# Patient Record
Sex: Female | Born: 1985 | Race: White | Hispanic: No | Marital: Married | Smoking: Never smoker
Health system: Southern US, Community
[De-identification: ages and names within clinical notes are randomized; demographics above are authoritative.]

## PROBLEM LIST (undated history)

## (undated) DIAGNOSIS — G43909 Migraine, unspecified, not intractable, without status migrainosus: Secondary | ICD-10-CM

## (undated) HISTORY — PX: TUBAL LIGATION: SHX77

---

## 2005-09-11 ENCOUNTER — Other Ambulatory Visit: Admission: RE | Admit: 2005-09-11 | Discharge: 2005-09-11 | Payer: Self-pay | Admitting: Obstetrics and Gynecology

## 2006-01-27 ENCOUNTER — Ambulatory Visit (HOSPITAL_COMMUNITY): Admission: RE | Admit: 2006-01-27 | Discharge: 2006-01-27 | Payer: Self-pay | Admitting: Obstetrics and Gynecology

## 2006-02-25 ENCOUNTER — Inpatient Hospital Stay (HOSPITAL_COMMUNITY): Admission: AD | Admit: 2006-02-25 | Discharge: 2006-02-26 | Payer: Self-pay | Admitting: Gynecology

## 2006-03-01 ENCOUNTER — Inpatient Hospital Stay (HOSPITAL_COMMUNITY): Admission: AD | Admit: 2006-03-01 | Discharge: 2006-03-01 | Payer: Self-pay | Admitting: Obstetrics & Gynecology

## 2006-03-24 ENCOUNTER — Inpatient Hospital Stay (HOSPITAL_COMMUNITY): Admission: AD | Admit: 2006-03-24 | Discharge: 2006-03-24 | Payer: Self-pay | Admitting: Obstetrics and Gynecology

## 2006-04-04 ENCOUNTER — Inpatient Hospital Stay (HOSPITAL_COMMUNITY): Admission: AD | Admit: 2006-04-04 | Discharge: 2006-04-04 | Payer: Self-pay | Admitting: Obstetrics and Gynecology

## 2006-04-14 ENCOUNTER — Inpatient Hospital Stay (HOSPITAL_COMMUNITY): Admission: AD | Admit: 2006-04-14 | Discharge: 2006-04-15 | Payer: Self-pay | Admitting: Obstetrics and Gynecology

## 2006-04-17 ENCOUNTER — Inpatient Hospital Stay (HOSPITAL_COMMUNITY): Admission: AD | Admit: 2006-04-17 | Discharge: 2006-04-20 | Payer: Self-pay | Admitting: Obstetrics & Gynecology

## 2007-01-13 ENCOUNTER — Ambulatory Visit (HOSPITAL_COMMUNITY): Admission: RE | Admit: 2007-01-13 | Discharge: 2007-01-13 | Payer: Self-pay | Admitting: Obstetrics and Gynecology

## 2007-01-19 ENCOUNTER — Ambulatory Visit (HOSPITAL_COMMUNITY): Admission: RE | Admit: 2007-01-19 | Discharge: 2007-01-19 | Payer: Self-pay | Admitting: Obstetrics and Gynecology

## 2007-04-05 ENCOUNTER — Inpatient Hospital Stay (HOSPITAL_COMMUNITY): Admission: AD | Admit: 2007-04-05 | Discharge: 2007-04-05 | Payer: Self-pay | Admitting: Obstetrics & Gynecology

## 2007-04-07 ENCOUNTER — Inpatient Hospital Stay (HOSPITAL_COMMUNITY): Admission: AD | Admit: 2007-04-07 | Discharge: 2007-04-08 | Payer: Self-pay | Admitting: Obstetrics and Gynecology

## 2008-05-24 IMAGING — US US OB LIMITED
1 series · 14 of 25 positions shown · non-contrast
Comparison: none

OBSTETRICAL ULTRASOUND:

 This ultrasound exam was performed in the [HOSPITAL] Ultrasound Department.  The OB US report was generated in the AS system, and faxed to the ordering physician.  This report is also available in [REDACTED] PACS.

[Series 1: us ob limited · 0.32mm/px · 14 of 25 slices shown]
[im 1/25]
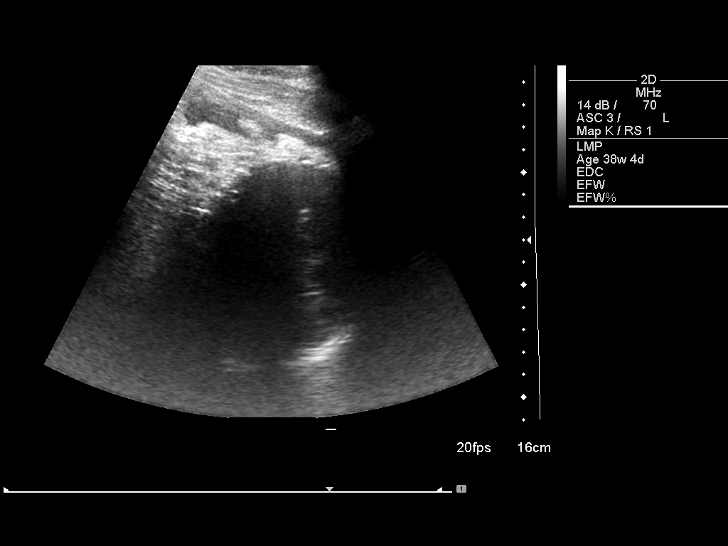
[im 3/25]
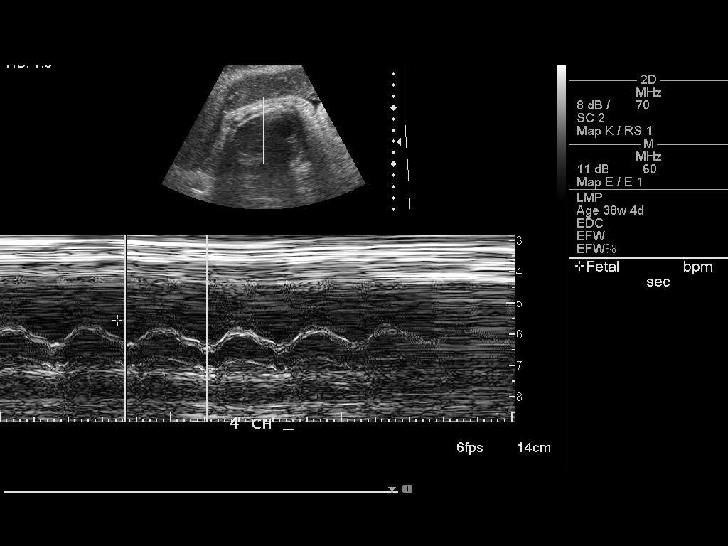
[im 5/25]
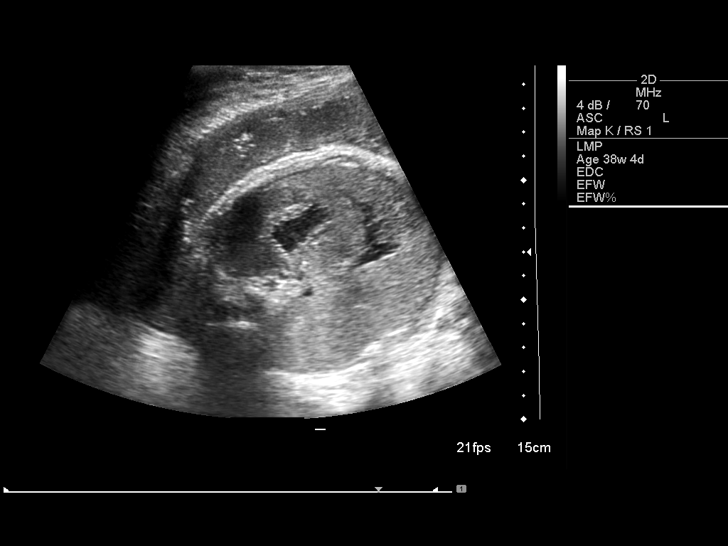
[im 7/25]
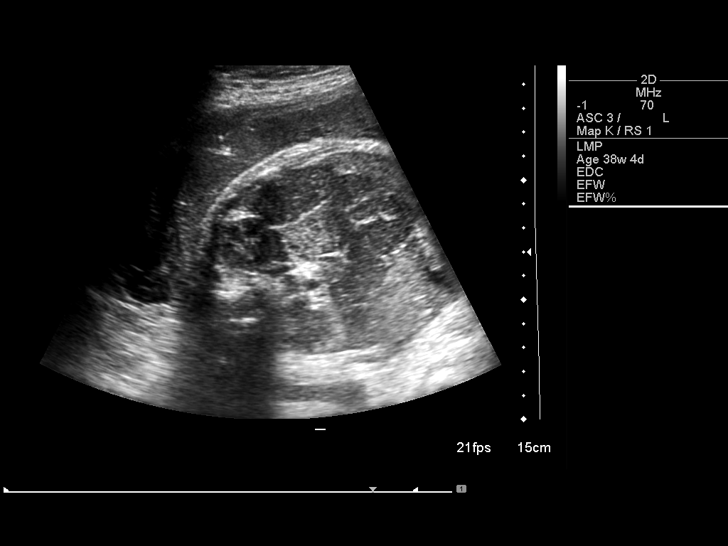
[im 9/25]
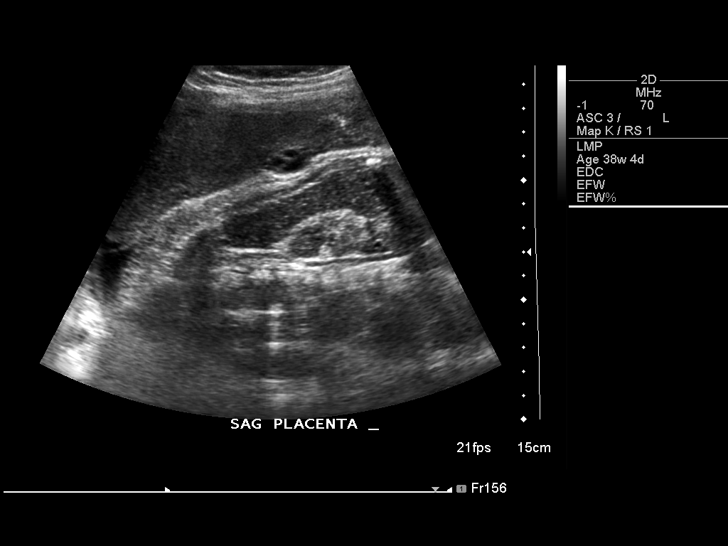
[im 10/25]
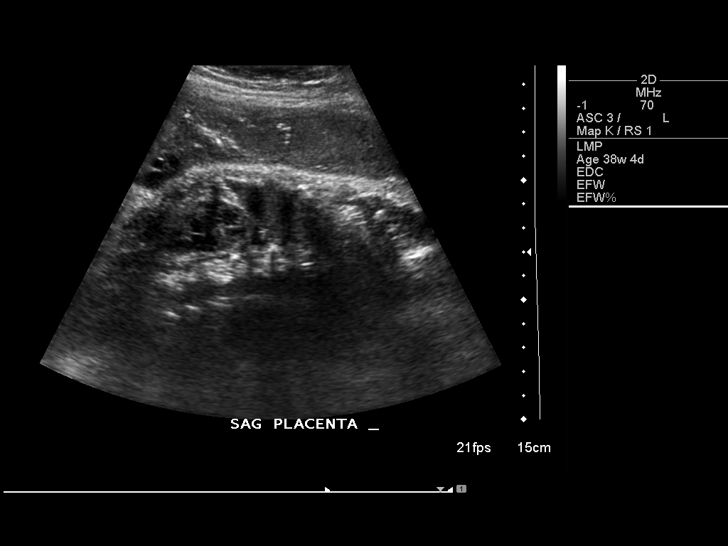
[im 12/25]
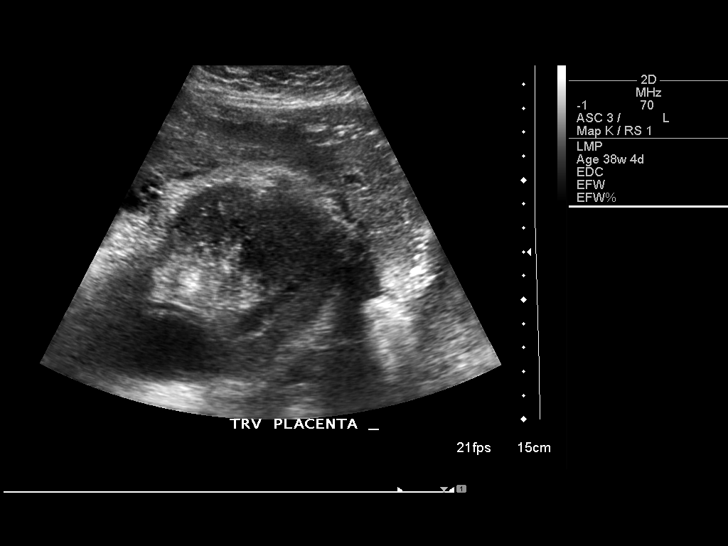
[im 14/25]
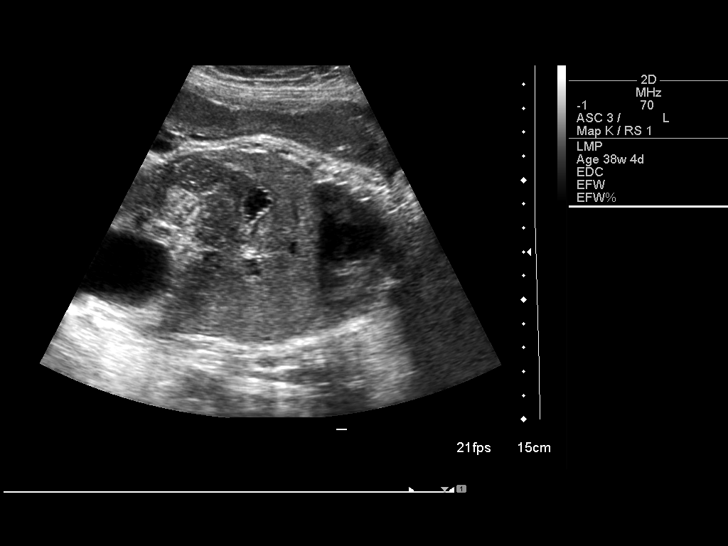
[im 16/25]
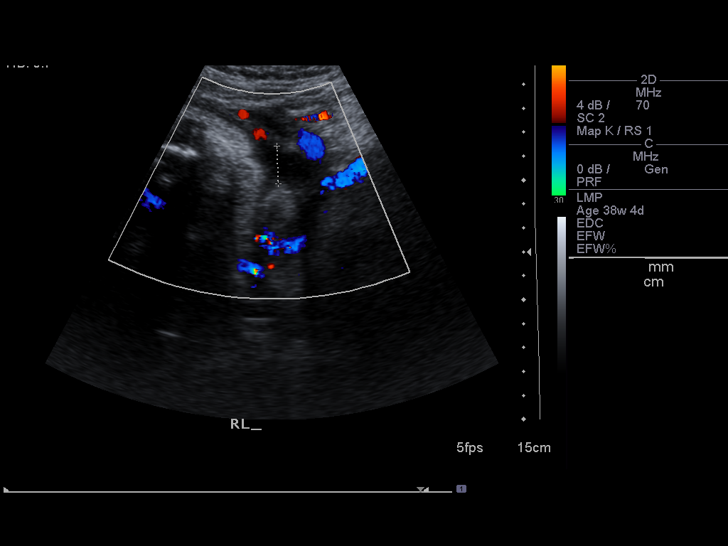
[im 17/25]
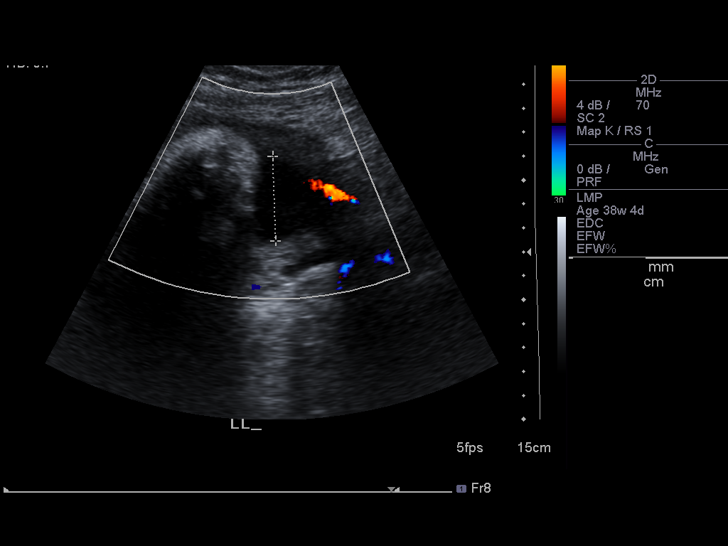
[im 19/25]
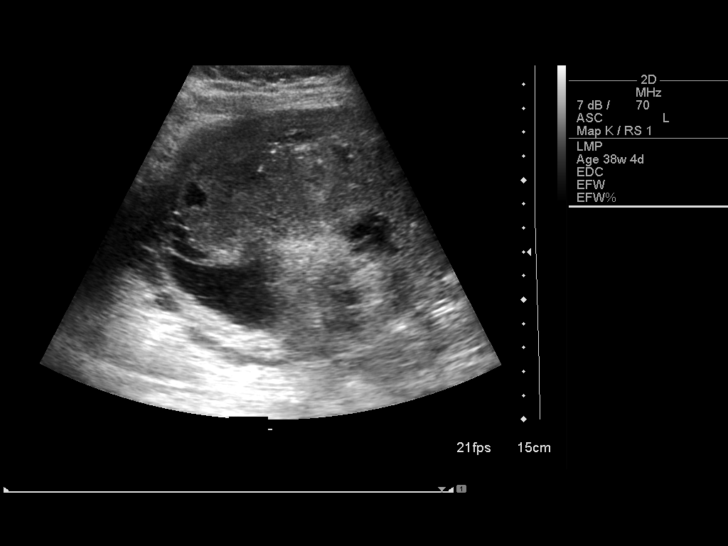
[im 21/25]
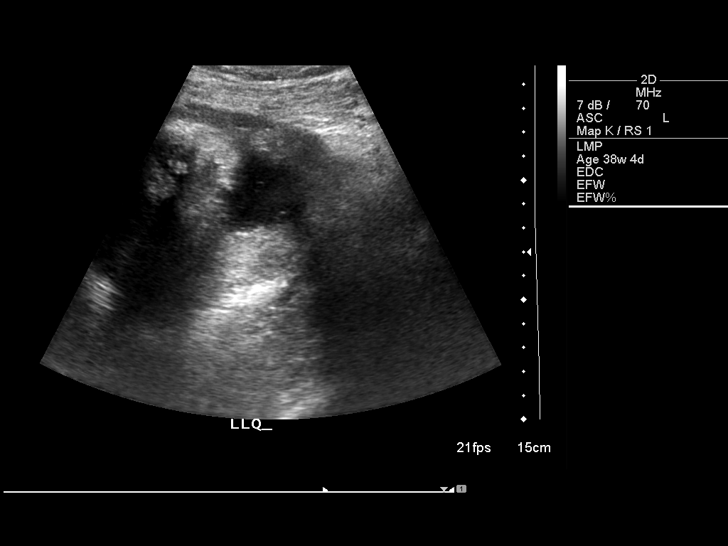
[im 23/25]
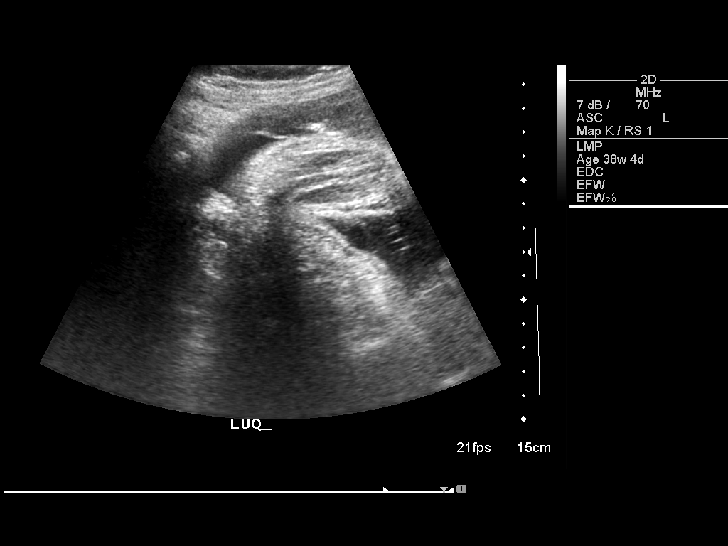
[im 25/25]
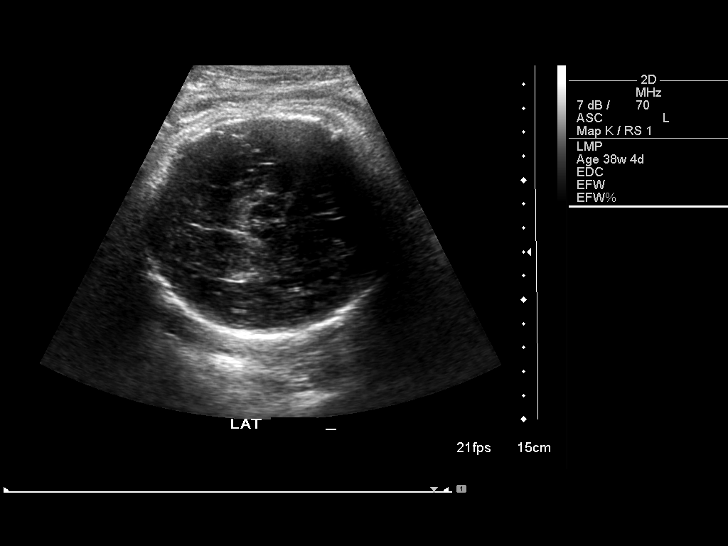

[14 of 25 positions shown; findings below may reference images not displayed]

IMPRESSION: See AS Obstetric US report.

## 2010-02-07 ENCOUNTER — Emergency Department (HOSPITAL_COMMUNITY): Admission: EM | Admit: 2010-02-07 | Discharge: 2010-02-07 | Payer: Self-pay | Admitting: Emergency Medicine

## 2011-04-10 LAB — CBC
HCT: 36.2
Hemoglobin: 12.1
Hemoglobin: 12.5
MCHC: 34.9
MCV: 90.4
MCV: 90.6
Platelets: 153
RBC: 4
WBC: 12 — ABNORMAL HIGH
WBC: 9.1

## 2011-04-10 LAB — RPR: RPR Ser Ql: NONREACTIVE

## 2011-04-14 LAB — RH IMMUNE GLOBULIN WORKUP (NOT WOMEN'S HOSP): Antibody Screen: NEGATIVE

## 2011-04-14 LAB — GLUCOSE TOLERANCE, 1 HOUR: Glucose, 1 Hour GTT: 102

## 2018-06-03 ENCOUNTER — Other Ambulatory Visit: Payer: Self-pay

## 2018-06-03 ENCOUNTER — Emergency Department (INDEPENDENT_AMBULATORY_CARE_PROVIDER_SITE_OTHER)
Admission: EM | Admit: 2018-06-03 | Discharge: 2018-06-03 | Disposition: A | Discharge status: Home / Self Care | Payer: BLUE CROSS/BLUE SHIELD | Source: Home / Self Care

## 2018-06-03 ENCOUNTER — Encounter: Payer: Self-pay | Admitting: Family Medicine

## 2018-06-03 DIAGNOSIS — G43709 Chronic migraine without aura, not intractable, without status migrainosus: Secondary | ICD-10-CM | POA: Diagnosis not present

## 2018-06-03 MED ORDER — SUMATRIPTAN SUCCINATE 100 MG PO TABS: 100.0000 mg | ORAL_TABLET | ORAL | 3 refills | Status: DC | PRN

## 2018-06-03 MED ORDER — DEXAMETHASONE SODIUM PHOSPHATE 10 MG/ML IJ SOLN
10.0000 mg | Freq: Once | INTRAMUSCULAR | Status: AC
  Administered 2018-06-03: 10 mg via INTRAMUSCULAR

## 2018-06-03 NOTE — ED Provider Notes (Signed)
[facility]    CSN: [id number] Arrival date & time: 06/03/18  1617     History   Chief Complaint Chief Complaint  Patient presents with  . Headache    HPI Nancy Gregory is a 32 y.o. female.   This is an initial [place] urgent care visit for this 32 year old woman who reports she has had migraines since having her fourth child, but never been diagnosed by a Provider. It is more on the right side of her head. Does have light sensitivity and N/V. Took some tylenol at 0900 and ibuprofen at 1400 - vomited both back up. She does report the last migraine she had was about 4 months ago, she had eaten garlic at the time. She also had garlic last night.   She is currently on the keto diet and gave up caffeine 2 weeks ago.   Patient describes typical migraine: Right-sided headache often radiating to behind the eye and in the ear and down the right side of the neck.  Its throbbing.  It is associated with nausea and vomiting.  Typically last 12 to 24 hours.  She gets them every 1 to 2 months.  This particular headache was incapacitating and that she had vomiting and such severe headache that she was unable to take care of her kids: 15, 11, 7, and 3.     History reviewed. No pertinent past medical history.  There are no active problems to display for this patient.   History reviewed. No pertinent surgical history.  OB History   None      Home Medications    Prior to Admission medications   Medication Sig Start Date End Date Taking? Authorizing Provider  SUMAtriptan (IMITREX) 100 MG tablet Take 1 tablet (100 mg total) by mouth every 2 (two) hours as needed for migraine. May repeat in 2 hours if headache persists or recurs. 06/03/18   Elvina Sidle, MD    Family History No family history on file.  Social History Social History   Tobacco Use  . Smoking status: Not on file  Substance Use Topics  . Alcohol use: Not on file  . Drug use: Not on file      Allergies   Patient has no known allergies.   Review of Systems Review of Systems  Constitutional: Negative.   HENT: Negative.   Eyes: Positive for pain.  Respiratory: Negative.   Gastrointestinal: Positive for nausea and vomiting.  Neurological: Positive for headaches.  All other systems reviewed and are negative.    Physical Exam Triage Vital Signs ED Triage Vitals  Enc Vitals Group     BP 06/03/18 1634 119/83     Pulse Rate 06/03/18 1634 (!) 131     Resp --      Temp 06/03/18 1634 98.3 F (36.8 C)     Temp Source 06/03/18 1634 Oral     SpO2 06/03/18 1634 99 %     Weight 06/03/18 1635 187 lb (84.8 kg)     Height 06/03/18 1635 5\' 6"  (1.676 m)     Head Circumference --      Peak Flow --      Pain Score 06/03/18 1635 8     Pain Loc --      Pain Edu? --      Excl. in GC? --    No data found.  Updated Vital Signs BP 119/83 (BP Location: Left Arm)   Pulse (!) 131   Temp 98.3 F (36.8 C) (  Oral)   Ht 5\' 6"  (1.676 m)   Wt 84.8 kg   SpO2 99%   BMI 30.18 kg/m    Physical Exam  Constitutional: She is oriented to person, place, and time. She appears well-developed and well-nourished.  HENT:  Head: Normocephalic.  Mouth/Throat: Oropharynx is clear and moist.  Eyes: Pupils are equal, round, and reactive to light. EOM are normal.  Neck: Normal range of motion. Neck supple.  Pulmonary/Chest: Effort normal.  Abdominal: Soft.  Musculoskeletal: Normal range of motion.  Neurological: She is alert and oriented to person, place, and time. She has normal strength. She is not disoriented. No cranial nerve deficit or sensory deficit. She displays a negative Romberg sign. Coordination and gait normal.  Skin: Skin is warm.  Psychiatric: She has a normal mood and affect. Her behavior is normal.  Nursing note and vitals reviewed.    UC Treatments / Results  Labs (all labs ordered are listed, but only abnormal results are displayed) Labs Reviewed - No data to  display  EKG None  Radiology No results found.  Procedures Procedures (including critical care time)  Medications Ordered in UC Medications  dexamethasone (DECADRON) injection 10 mg (has no administration in time range)    Initial Impression / Assessment and Plan / UC Course  I have reviewed the triage vital signs and the nursing notes.  Pertinent labs & imaging results that were available during my care of the patient were reviewed by me and considered in my medical decision making (see chart for details).    Final Clinical Impressions(s) / UC Diagnoses   Final diagnoses:  Chronic migraine without aura without status migrainosus, not intractable     Discharge Instructions     Limit Imitrex to 2 tablets in any 24-hour.    ED Prescriptions    Medication Sig Dispense Auth. Provider   SUMAtriptan (IMITREX) 100 MG tablet Take 1 tablet (100 mg total) by mouth every 2 (two) hours as needed for migraine. May repeat in 2 hours if headache persists or recurs. 10 tablet Elvina SidleLauenstein, Yarenis Cerino, MD     Controlled Substance Prescriptions [place] Controlled Substance Registry consulted? Not Applicable   Elvina SidleLauenstein, Jayani Rozman, MD 06/03/18 1700

## 2018-06-03 NOTE — ED Triage Notes (Signed)
Pt reports she has had migraines since having her fourth child, but never been diagnosed by a Provider. It is more on the right side of her head. Does have light sensitivity and N/V. Took some tylenol at 0900 and ibuprofen at 1400 - vomited both back up. She does report the last migraine she had was about 4 months ago, she had eaten garlic at the time. She also had garlic last night.   She is currently on the keto diet and gave up caffeine 2 weeks ago.

## 2018-06-03 NOTE — Discharge Instructions (Addendum)
Limit Imitrex to 2 tablets in any 24-hour.

## 2018-07-07 ENCOUNTER — Encounter: Payer: Self-pay | Admitting: *Deleted

## 2018-07-07 ENCOUNTER — Emergency Department (INDEPENDENT_AMBULATORY_CARE_PROVIDER_SITE_OTHER)
Admission: EM | Admit: 2018-07-07 | Discharge: 2018-07-07 | Disposition: A | Discharge status: Home / Self Care | Payer: BLUE CROSS/BLUE SHIELD | Source: Home / Self Care | Attending: Family Medicine | Admitting: Family Medicine

## 2018-07-07 ENCOUNTER — Other Ambulatory Visit: Payer: Self-pay

## 2018-07-07 DIAGNOSIS — R109 Unspecified abdominal pain: Secondary | ICD-10-CM

## 2018-07-07 DIAGNOSIS — N3001 Acute cystitis with hematuria: Secondary | ICD-10-CM

## 2018-07-07 HISTORY — DX: Migraine, unspecified, not intractable, without status migrainosus: G43.909

## 2018-07-07 LAB — POCT URINALYSIS DIP (MANUAL ENTRY)
Glucose, UA: NEGATIVE mg/dL
Nitrite, UA: NEGATIVE
Protein Ur, POC: 30 mg/dL — AB
Spec Grav, UA: 1.015 (ref 1.010–1.025)
Urobilinogen, UA: 1 E.U./dL
pH, UA: 7 (ref 5.0–8.0)

## 2018-07-07 MED ORDER — CEPHALEXIN 500 MG PO CAPS: 500.0000 mg | ORAL_CAPSULE | Freq: Two times a day (BID) | ORAL | 0 refills | Status: DC

## 2018-07-07 NOTE — Discharge Instructions (Signed)
  Please take your antibiotic as prescribed. A urine culture has been sent to check the severity of your urinary infection and to determine if you are on the most appropriate antibiotic. The results should come back within 2-3 days and you will be notified even if no medication change is needed.  Please stay well hydrated and follow up with your family doctor in 1 week if not improving, sooner if worsening.  

## 2018-07-07 NOTE — ED Provider Notes (Signed)
[facility]    CSN: [id number] Arrival date & time: 07/07/18  1119     History   Chief Complaint Chief Complaint  Patient presents with  . Urinary Frequency  . Flank Pain    HPI Nancy Gregory is a 33 y.o. female.   HPI Nancy Gregory is a 32 y.o. female presenting to UC with c/o 2-3 days of urinary frequency and lower abdominal cramping radiating to her back on occasion. She was on vacation recently and was "holding" her urine for long periods of times. She also did not shower the last 2 days and wonders if that has contributed to her symptoms. Last UTI was a few years ago.  Denies fever or chills. She has had nausea with loss of appetite. No vomiting or diarrhea. She has not tried any OTC medications.    Past Medical History:  Diagnosis Date  . Migraine     There are no active problems to display for this patient.   Past Surgical History:  Procedure Laterality Date  . TUBAL LIGATION      OB History   No obstetric history on file.      Home Medications    Prior to Admission medications   Medication Sig Start Date End Date Taking? Authorizing Provider  cephALEXin (KEFLEX) 500 MG capsule Take 1 capsule (500 mg total) by mouth 2 (two) times daily. 07/07/18   Lurene Shadow, PA-C  SUMAtriptan (IMITREX) 100 MG tablet Take 1 tablet (100 mg total) by mouth every 2 (two) hours as needed for migraine. May repeat in 2 hours if headache persists or recurs. 06/03/18   Elvina Sidle, MD    Family History History reviewed. No pertinent family history.  Social History Social History   Tobacco Use  . Smoking status: Never Smoker  . Smokeless tobacco: Never Used  Substance Use Topics  . Alcohol use: Not on file  . Drug use: Not on file     Allergies   Patient has no known allergies.   Review of Systems Review of Systems  Constitutional: Negative for chills and fever.  Gastrointestinal: Positive for abdominal pain. Negative for diarrhea, nausea and  vomiting.  Genitourinary: Positive for frequency, pelvic pain and urgency. Negative for dysuria, vaginal discharge and vaginal pain.  Musculoskeletal: Positive for back pain. Negative for myalgias.  Neurological: Negative for dizziness and headaches.     Physical Exam Triage Vital Signs ED Triage Vitals  Enc Vitals Group     BP 07/07/18 1141 119/88     Pulse Rate 07/07/18 1141 100     Resp 07/07/18 1141 16     Temp 07/07/18 1141 98.1 F (36.7 C)     Temp Source 07/07/18 1141 Oral     SpO2 07/07/18 1141 100 %     Weight 07/07/18 1142 185 lb (83.9 kg)     Height --      Head Circumference --      Peak Flow --      Pain Score 07/07/18 1142 0     Pain Loc --      Pain Edu? --      Excl. in GC? --    No data found.  Updated Vital Signs BP 119/88 (BP Location: Right Arm)   Pulse 100   Temp 98.1 F (36.7 C) (Oral)   Resp 16   Wt 185 lb (83.9 kg)   LMP 06/21/2018   SpO2 100%   BMI 29.86 kg/m   Visual Acuity  Right Eye Distance:   Left Eye Distance:   Bilateral Distance:    Right Eye Near:   Left Eye Near:    Bilateral Near:     Physical Exam Vitals signs and nursing note reviewed.  Constitutional:      Appearance: Normal appearance. She is well-developed.  HENT:     Head: Normocephalic and atraumatic.     Mouth/Throat:     Mouth: Mucous membranes are moist.  Neck:     Musculoskeletal: Normal range of motion.  Cardiovascular:     Rate and Rhythm: Normal rate and regular rhythm.  Pulmonary:     Effort: Pulmonary effort is normal.     Breath sounds: Normal breath sounds.  Abdominal:     General: There is no distension.     Palpations: Abdomen is soft. There is no mass.     Tenderness: There is abdominal tenderness ( suprapubic). There is no right CVA tenderness, left CVA tenderness, guarding or rebound.     Hernia: No hernia is present.  Musculoskeletal: Normal range of motion.  Skin:    General: Skin is warm and dry.  Neurological:     Mental Status: She  is alert and oriented to person, place, and time.  Psychiatric:        Behavior: Behavior normal.      UC Treatments / Results  Labs (all labs ordered are listed, but only abnormal results are displayed) Labs Reviewed  POCT URINALYSIS DIP (MANUAL ENTRY) - Abnormal; Notable for the following components:      Result Value   Clarity, UA cloudy (*)    Bilirubin, UA small (*)    Ketones, POC UA moderate (40) (*)    Blood, UA trace-intact (*)    Protein Ur, POC =30 (*)    Leukocytes, UA Moderate (2+) (*)    All other components within normal limits  URINE CULTURE    EKG None  Radiology No results found.  Procedures Procedures (including critical care time)  Medications Ordered in UC Medications - No data to display  Initial Impression / Assessment and Plan / UC Course  I have reviewed the triage vital signs and the nursing notes.  Pertinent labs & imaging results that were available during my care of the patient were reviewed by me and considered in my medical decision making (see chart for details).     Hx and UA c/w UTI Rx: keflex Culture sent  Final Clinical Impressions(s) / UC Diagnoses   Final diagnoses:  Flank pain  Acute cystitis with hematuria     Discharge Instructions      Please take your antibiotic as prescribed. A urine culture has been sent to check the severity of your urinary infection and to determine if you are on the most appropriate antibiotic. The results should come back within 2-3 days and you will be notified even if no medication change is needed.  Please stay well hydrated and follow up with your family doctor in 1 week if not improving, sooner if worsening.     ED Prescriptions    Medication Sig Dispense Auth. Provider   cephALEXin (KEFLEX) 500 MG capsule Take 1 capsule (500 mg total) by mouth 2 (two) times daily. 14 capsule Lurene ShadowPhelps, Eliseo Withers O, PA-C     Controlled Substance Prescriptions [place] Controlled Substance Registry consulted?  Not Applicable   Rolla Platehelps, Sherline Eberwein O, PA-C 07/07/18 1209

## 2018-07-07 NOTE — ED Triage Notes (Signed)
Patient c/o 2-3 days of urinary frequency, bladder pressure and flank pain.

## 2018-07-08 LAB — URINE CULTURE
MICRO NUMBER:: 27912
SPECIMEN QUALITY:: ADEQUATE

## 2018-07-09 ENCOUNTER — Telehealth: Payer: Self-pay

## 2018-07-09 NOTE — Telephone Encounter (Signed)
Spoke with Nancy Gregory and she stated that she feels a lot better since starting the antibiotics. I advised pt about urine culture results and for her to continue taking abx as prescribed. Also advised her to increase water consumption and to call back w/ any questions or concerns.

## 2018-08-10 ENCOUNTER — Emergency Department (INDEPENDENT_AMBULATORY_CARE_PROVIDER_SITE_OTHER)
Admission: EM | Admit: 2018-08-10 | Discharge: 2018-08-10 | Disposition: A | Discharge status: Home / Self Care | Payer: BLUE CROSS/BLUE SHIELD | Source: Home / Self Care | Attending: Family Medicine | Admitting: Family Medicine

## 2018-08-10 ENCOUNTER — Encounter: Payer: Self-pay | Admitting: *Deleted

## 2018-08-10 ENCOUNTER — Other Ambulatory Visit: Payer: Self-pay

## 2018-08-10 DIAGNOSIS — N3001 Acute cystitis with hematuria: Secondary | ICD-10-CM

## 2018-08-10 LAB — POCT URINALYSIS DIP (MANUAL ENTRY)
Bilirubin, UA: NEGATIVE
Glucose, UA: NEGATIVE mg/dL
Nitrite, UA: NEGATIVE
Protein Ur, POC: NEGATIVE mg/dL
Spec Grav, UA: 1.02 (ref 1.010–1.025)
Urobilinogen, UA: 0.2 E.U./dL
pH, UA: 6 (ref 5.0–8.0)

## 2018-08-10 MED ORDER — AMOXICILLIN-POT CLAVULANATE 875-125 MG PO TABS: 1.0000 | ORAL_TABLET | Freq: Two times a day (BID) | ORAL | 0 refills | Status: DC

## 2018-08-10 NOTE — ED Provider Notes (Signed)
[facility]    CSN: [id number] Arrival date & time: 08/10/18  1829     History   Chief Complaint Chief Complaint  Patient presents with  . Abdominal Pain    HPI Moonyeen Langell is a 33 y.o. female.   HPI  Kevona Bush is a 33 y.o. female presenting to UC with c/o Right lower abdominal pain that started last night. Dull ache, radiating in to Right flank/back. Hx of similar pain last month when she had a UTI.  She completed the prescribed Keflex for UTI last month and symptoms fully resolved within a few days. No symptoms again until last night. She reports getting a new bubble bath for Christmas and wonders if that could be causing her symptoms as she never had urinary symptoms until recently and read on the label of the bubble bath that it could cause urinary symptoms. Temp of 99.4*F. no chills. No n/v/d.   Past Medical History:  Diagnosis Date  . Migraine     There are no active problems to display for this patient.   Past Surgical History:  Procedure Laterality Date  . TUBAL LIGATION      OB History   No obstetric history on file.      Home Medications    Prior to Admission medications   Medication Sig Start Date End Date Taking? Authorizing Provider  SUMAtriptan (IMITREX) 100 MG tablet 1 TAB EVERY 2 HOURS AS NEEDED FOR MIGRAINE. MAY REPEAT IN 2 HOURS IF HEADACHE PERSISTS OR RECURS. 06/03/18  Yes [provider]  amoxicillin-clavulanate (AUGMENTIN) 875-125 MG tablet Take 1 tablet by mouth 2 (two) times daily. One po bid x 7 days 08/10/18   Rolla Plate    Family History History reviewed. No pertinent family history.  Social History Social History   Tobacco Use  . Smoking status: Never Smoker  . Smokeless tobacco: Never Used  Substance Use Topics  . Alcohol use: Never    Frequency: Never  . Drug use: Never     Allergies   Duloxetine hcl; Sertraline; and Sertraline hcl   Review of Systems Review of Systems    Constitutional: Negative for chills and fever.  Gastrointestinal: Positive for abdominal pain. Negative for diarrhea, nausea and vomiting.  Genitourinary: Positive for flank pain. Negative for dysuria, frequency, hematuria, pelvic pain and urgency.  Musculoskeletal: Negative for back pain.  Neurological: Negative for dizziness and headaches.     Physical Exam Triage Vital Signs ED Triage Vitals  Enc Vitals Group     BP      Pulse      Resp      Temp      Temp src      SpO2      Weight      Height      Head Circumference      Peak Flow      Pain Score      Pain Loc      Pain Edu?      Excl. in GC?    No data found.  Updated Vital Signs BP 137/85 (BP Location: Right Arm)   Pulse (!) 122   Temp 98.9 F (37.2 C) (Oral)   Resp 18   Ht 5\' 6"  (1.676 m)   Wt 185 lb (83.9 kg)   LMP 08/10/2018   SpO2 97%   BMI 29.86 kg/m   Visual Acuity Right Eye Distance:   Left Eye Distance:   Bilateral Distance:  Right Eye Near:   Left Eye Near:    Bilateral Near:     Physical Exam Vitals signs and nursing note reviewed.  Constitutional:      Appearance: She is well-developed.  HENT:     Head: Normocephalic and atraumatic.     Mouth/Throat:     Mouth: Mucous membranes are moist.  Neck:     Musculoskeletal: Normal range of motion.  Cardiovascular:     Rate and Rhythm: Regular rhythm. Tachycardia present.  Pulmonary:     Effort: Pulmonary effort is normal.     Breath sounds: Normal breath sounds.  Abdominal:     Palpations: Abdomen is soft.     Tenderness: There is no abdominal tenderness. There is no right CVA tenderness or left CVA tenderness.  Musculoskeletal: Normal range of motion.  Skin:    General: Skin is warm and dry.  Neurological:     Mental Status: She is alert and oriented to person, place, and time.  Psychiatric:        Behavior: Behavior normal.      UC Treatments / Results  Labs (all labs ordered are listed, but only abnormal results are  displayed) Labs Reviewed - No data to display  EKG None  Radiology No results found.  Procedures Procedures (including critical care time)  Medications Ordered in UC Medications - No data to display  Initial Impression / Assessment and Plan / UC Course  I have reviewed the triage vital signs and the nursing notes.  Pertinent labs & imaging results that were available during my care of the patient were reviewed by me and considered in my medical decision making (see chart for details).     UA c/w UTI Culture sent Based on urine culture last month, grew strep, will start pt on Augmentin  Final Clinical Impressions(s) / UC Diagnoses   Final diagnoses:  Acute cystitis with hematuria     Discharge Instructions      Please take your antibiotic as prescribed. A urine culture has been sent to check the severity of your urinary infection and to determine if you are on the most appropriate antibiotic. The results should come back within 2-3 days and you will be notified even if no medication change is needed.  Please stay well hydrated and follow up with your family doctor in 1 week or with your OB/GYN for recheck of symptoms if not improving, sooner if worsening or if symptoms keep recurring each month.     ED Prescriptions    Medication Sig Dispense Auth. Provider   amoxicillin-clavulanate (AUGMENTIN) 875-125 MG tablet Take 1 tablet by mouth 2 (two) times daily. One po bid x 7 days 14 tablet Lurene ShadowPhelps, Roverto Bodmer O, [place]PA-C     Controlled Substance Prescriptions [place] Controlled Substance Registry consulted? Not Applicable   Rolla Platehelps, Keani Gotcher O, PA-C 08/10/18 1905

## 2018-08-10 NOTE — Discharge Instructions (Signed)
°  Please take your antibiotic as prescribed. A urine culture has been sent to check the severity of your urinary infection and to determine if you are on the most appropriate antibiotic. The results should come back within 2-3 days and you will be notified even if no medication change is needed.  Please stay well hydrated and follow up with your family doctor in 1 week or with your OB/GYN for recheck of symptoms if not improving, sooner if worsening or if symptoms keep recurring each month.

## 2018-08-10 NOTE — ED Triage Notes (Signed)
Pt c/o RLQ abd pain x 1 day. Denies dysuria, fever or nausea. Last dose Tylenol 1630.

## 2018-08-11 LAB — URINE CULTURE
MICRO NUMBER:: 180324
SPECIMEN QUALITY:: ADEQUATE

## 2018-08-12 ENCOUNTER — Telehealth: Payer: Self-pay | Admitting: *Deleted

## 2018-08-12 NOTE — Telephone Encounter (Signed)
Spoke to pt given Ucx results. She reports that she is feeling some better. Advised her to complete ABT and f/u with her PCP if she fails to improve.

## 2019-03-27 ENCOUNTER — Emergency Department (INDEPENDENT_AMBULATORY_CARE_PROVIDER_SITE_OTHER)
Admission: EM | Admit: 2019-03-27 | Discharge: 2019-03-27 | Disposition: A | Discharge status: Home / Self Care | Payer: BC Managed Care – PPO | Source: Home / Self Care | Attending: Family Medicine | Admitting: Family Medicine

## 2019-03-27 ENCOUNTER — Other Ambulatory Visit: Payer: Self-pay

## 2019-03-27 DIAGNOSIS — G43009 Migraine without aura, not intractable, without status migrainosus: Secondary | ICD-10-CM

## 2019-03-27 MED ORDER — KETOROLAC TROMETHAMINE 60 MG/2ML IM SOLN
60.0000 mg | Freq: Once | INTRAMUSCULAR | Status: AC
  Administered 2019-03-27: 15:00:00 60 mg via INTRAMUSCULAR

## 2019-03-27 MED ORDER — METOCLOPRAMIDE HCL 5 MG/ML IJ SOLN
5.0000 mg | Freq: Once | INTRAMUSCULAR | Status: AC
  Administered 2019-03-27: 5 mg via INTRAMUSCULAR

## 2019-03-27 MED ORDER — DEXAMETHASONE SODIUM PHOSPHATE 10 MG/ML IJ SOLN
10.0000 mg | Freq: Once | INTRAMUSCULAR | Status: AC
  Administered 2019-03-27: 10 mg via INTRAMUSCULAR

## 2019-03-27 MED ORDER — FLUOXETINE HCL 10 MG PO CAPS: ORAL_CAPSULE | ORAL | 0 refills | Status: DC

## 2019-03-27 NOTE — ED Provider Notes (Signed)
[facility]    CSN: [id number] Arrival date & time: 03/27/19  1354      History   Chief Complaint Chief Complaint  Patient presents with  . Migraine    HPI Nancy Gregory is a 33 y.o. female.   Patient has a history of migraine headaches that generally occur about a day after her menstrual cycle begins.  She also admits that she tends to have very uncomfortable menstrual cramps.  Her present period began 3 days ago, and a typical right side throbbing migraine headache occurred at 4am today.  She has also developed typical light sensitivity and nausea (without vomiting).  She denies fevers, chills, and sweats and feels well otherwise. She had a similar headache in December that resolved with an injectable "migraine cocktail."  She was prescribed Imitrex at that time which caused her heart to race, and she discontinued it. Patient reports that she has an appointment scheduled in December for headache clinic.  The history is provided by the patient.  Migraine This is a recurrent problem. Episode onset: 10 hours ago. The problem occurs constantly. The problem has not changed since onset.Associated symptoms include headaches. The symptoms are aggravated by walking and stress. Nothing relieves the symptoms. Treatments tried: Advil and Tylenol. The treatment provided no relief.    Past Medical History:  Diagnosis Date  . Migraine     Active problems:  Migraine headaches.   Past Surgical History:  Procedure Laterality Date  . TUBAL LIGATION         Home Medications    Prior to Admission medications   Medication Sig Start Date End Date Taking? Authorizing Provider  amoxicillin-clavulanate (AUGMENTIN) 875-125 MG tablet Take 1 tablet by mouth 2 (two) times daily. One po bid x 7 days 08/10/18   Lurene ShadowPhelps, Erin O, PA-C  FLUoxetine (PROZAC) 10 MG capsule Take one cap PO daily at first sign of menses.  Continue for 7 to 10 days 03/27/19   Lattie HawBeese, Marty Sadlowski A, MD  SUMAtriptan  (IMITREX) 100 MG tablet 1 TAB EVERY 2 HOURS AS NEEDED FOR MIGRAINE. MAY REPEAT IN 2 HOURS IF HEADACHE PERSISTS OR RECURS. 06/03/18   [provider]    Family History History reviewed. No family history of migraines  Social History Social History   Tobacco Use  . Smoking status: Never Smoker  . Smokeless tobacco: Never Used  Substance Use Topics  . Alcohol use: Never    Frequency: Never  . Drug use: Never     Allergies   Duloxetine hcl, Sertraline, and Sertraline hcl   Review of Systems Review of Systems  Constitutional: Positive for activity change, appetite change and fatigue. Negative for chills, diaphoresis and fever.  HENT: Negative for congestion, facial swelling and sinus pain.   Eyes: Positive for photophobia. Negative for visual disturbance.  Respiratory: Negative.   Cardiovascular: Negative.   Gastrointestinal: Positive for nausea.  Genitourinary: Negative.   Musculoskeletal: Negative.   Skin: Negative.   Neurological: Positive for headaches. Negative for dizziness, tremors, seizures, syncope, facial asymmetry, speech difficulty, weakness, light-headedness and numbness.     Physical Exam Triage Vital Signs ED Triage Vitals  Enc Vitals Group     BP 03/27/19 1415 131/87     Pulse Rate 03/27/19 1422 (!) 102     Resp --      Temp 03/27/19 1415 98.2 F (36.8 C)     Temp Source 03/27/19 1415 Oral     SpO2 03/27/19 1415 100 %  Weight 03/27/19 1420 187 lb 6.3 oz (85 kg)     Height 03/27/19 1420 5\' 6"  (1.676 m)     Head Circumference --      Peak Flow --      Pain Score 03/27/19 1420 8     Pain Loc --      Pain Edu? --      Excl. in [place]? --    No data found.  Updated Vital Signs BP 131/87 (BP Location: Right Arm)   Pulse (!) 102   Temp 98.2 F (36.8 C) (Oral)   Ht 5\' 6"  (1.676 m)   Wt 85 kg   LMP 03/25/2019 (Exact Date)   SpO2 100%   BMI 30.25 kg/m   Visual Acuity Right Eye Distance:   Left Eye Distance:   Bilateral Distance:     Right Eye Near:   Left Eye Near:    Bilateral Near:     Physical Exam Nursing notes and Vital Signs reviewed. Appearance:  Patient appears stated age, and in no acute distress Eyes:  Pupils are equal, round, and reactive to light and accomodation.  Extraocular movement is intact.  Conjunctivae are not inflamed.  Fundi benign.  Mild photophobia present. Ears:  Canals normal.  Tympanic membranes normal.  Nose:   Normal turbinates.  No sinus tenderness.   Pharynx:  Normal Neck:  Supple.  Lungs:  Clear to auscultation.  Breath sounds are equal.  Moving air well. Heart:  Regular rate and rhythm without murmurs, rubs, or gallops.  Abdomen:  Nontender without masses or hepatosplenomegaly.  Bowel sounds are present.  No CVA or flank tenderness.  Extremities:  No edema.  Skin:  No rash present.   Neurologic:  Cranial nerves 2 through 12 are normal.  Patellar, achilles, and elbow reflexes are normal.  Cerebellar function is intact (finger-to-nose and rapid alternating hand movement).  Gait and station are normal.  Grip strength symmetric bilaterally.  Romberg negative.   UC Treatments / Results  Labs (all labs ordered are listed, but only abnormal results are displayed) Labs Reviewed - No data to display  EKG   Radiology No results found.  Procedures Procedures (including critical care time)  Medications Ordered in UC Medications  ketorolac (TORADOL) injection 60 mg (has no administration in time range)  dexamethasone (DECADRON) injection 10 mg (has no administration in time range)  metoCLOPramide (REGLAN) injection 5 mg (has no administration in time range)    Initial Impression / Assessment and Plan / UC Course  I have reviewed the triage vital signs and the nursing notes.  Pertinent labs & imaging results that were available during my care of the patient were reviewed by me and considered in my medical decision making (see chart for details).    Suspect that patient's  headaches are related to her menstrual cycle Administered Toradol 60mg  IM  Administered Decadron 10mg  IM. Administered Reglan 5mg  IM. For next cycle, begin trial of fluoxetine 10mg  on first day of period for 7 to 10 days  (patient notes that she does not have a true allergy to duloxetine and sertraline). Followup with headache clinic.   Final Clinical Impressions(s) / UC Diagnoses   Final diagnoses:  Migraine without aura and without status migrainosus, not intractable     Discharge Instructions     Begin fluoxetine on first day of next period.  If effective, follow-up with headache clinic to continue medication.  ED Prescriptions    Medication Sig Dispense Auth. Provider   FLUoxetine (PROZAC) 10 MG capsule Take one cap PO daily at first sign of menses.  Continue for 7 to 10 days 10 capsule Lattie Haw, MD        Lattie Haw, MD 03/29/19 1800

## 2019-03-27 NOTE — Discharge Instructions (Addendum)
Begin fluoxetine on first day of next period.  If effective, follow-up with headache clinic to continue medication.

## 2019-03-27 NOTE — ED Triage Notes (Signed)
Pt c/o migraine since last night. Nausea, light sensitivity, little appetite. Has tried Advil and tylenol with no relief. Was seen in Dec for similar problem. Was given "migraine cocktail" which helped and an rx for Imitrex. The Imitrex she says made her heart race which she did not like.

## 2019-04-08 ENCOUNTER — Inpatient Hospital Stay
Admission: RE | Admit: 2019-04-08 | Discharge: 2019-04-08 | Disposition: A | Discharge status: Home / Self Care | Payer: BC Managed Care – PPO | Source: Ambulatory Visit

## 2019-04-08 ENCOUNTER — Ambulatory Visit (INDEPENDENT_AMBULATORY_CARE_PROVIDER_SITE_OTHER)
Admission: RE | Admit: 2019-04-08 | Discharge: 2019-04-08 | Disposition: A | Discharge status: Home / Self Care | Payer: BC Managed Care – PPO | Source: Ambulatory Visit

## 2019-04-08 ENCOUNTER — Other Ambulatory Visit: Payer: Self-pay

## 2019-04-08 ENCOUNTER — Encounter: Payer: Self-pay | Admitting: Emergency Medicine

## 2019-04-08 DIAGNOSIS — J0100 Acute maxillary sinusitis, unspecified: Secondary | ICD-10-CM | POA: Diagnosis not present

## 2019-04-08 MED ORDER — AMOXICILLIN-POT CLAVULANATE 875-125 MG PO TABS: 1.0000 | ORAL_TABLET | Freq: Two times a day (BID) | ORAL | 0 refills | Status: AC

## 2019-04-08 NOTE — ED Provider Notes (Signed)
[facility] Virtual Visit via Video Note:  Tomeka Kantner  initiated request for Telemedicine visit with [facility] Urgent [facility] team. I connected with Zerita Boers  on 04/08/2019 at 5:22 PM  for a synchronized telemedicine visit using a video enabled HIPPA compliant telemedicine application. I verified that I am speaking with Zerita Boers  using two identifiers. Rennis Harding, PA-C  was physically located in a [facility] Urgent care site and Akeela Busk was located at a different location.   The limitations of evaluation and management by telemedicine as well as the availability of in-person appointments were discussed. Patient was informed that she  may incur a bill ( including co-pay) for this virtual visit encounter. Raenell Mensing  expressed understanding and gave verbal consent to proceed with virtual visit.   [id number] 04/08/19 Arrival Time: 1717  CC: Sinus infection  SUBJECTIVE: History from: patient.  Nancy Gregory is a 33 y.o. female who presents with RT maxillary sinus pain and congestion x 4-5 days.  Denies sick exposure to COVID, flu or strep.  Denies recent travel.   States she does not leave the house, groceries are delivered, kids do not go to daycare, and husband works from home.  Has tried mucinex without relief.  Reports previous symptoms in the past with sinus infection.   Denies fever, chills, fatigue, rhinorrhea, sore throat, SOB, wheezing, chest pain, nausea, changes in bowel or bladder habits.    ROS: As per HPI.  All other pertinent ROS negative.     Past Medical History:  Diagnosis Date  . Migraine    Past Surgical History:  Procedure Laterality Date  . TUBAL LIGATION     No Known Allergies No current facility-administered medications on file prior to encounter.    Current Outpatient Medications on File Prior to Encounter  Medication Sig Dispense Refill  . FLUoxetine (PROZAC) 10 MG capsule Take one cap PO daily at first sign of  menses.  Continue for 7 to 10 days 10 capsule 0  . SUMAtriptan (IMITREX) 100 MG tablet 1 TAB EVERY 2 HOURS AS NEEDED FOR MIGRAINE. MAY REPEAT IN 2 HOURS IF HEADACHE PERSISTS OR RECURS.      OBJECTIVE:   There were no vitals filed for this visit.  General appearance: alert; no distress Eyes: EOMI grossly HENT: normocephalic; atraumatic; localizes TTP over RT maxillary sinuses Neck: supple with FROM Lungs: normal respiratory effort; speaking in full sentences without difficulty Extremities: moves extremities without difficulty Skin: No obvious rashes Neurologic: No facial asymmetries Psychological: alert and cooperative; normal mood and affect  ASSESSMENT & PLAN:  1. Acute non-recurrent maxillary sinusitis     Meds ordered this encounter  Medications  . amoxicillin-clavulanate (AUGMENTIN) 875-125 MG tablet    Sig: Take 1 tablet by mouth every 12 (twelve) hours for 10 days.    Dispense:  20 tablet    Refill:  0    Order Specific Question:   Supervising Provider    Answer:   Eustace Moore [[id number]]   Low suspicion for COVID.   Patient declines COVID testing Rest and push fluids Augmentin prescribed.  Take as directed and to completion Continue with OTC ibuprofen/tylenol as needed for pain Follow up with PCP as needed Follow up in person or go to the ED if you have any new or worsening symptoms such as fever, chills, worsening sinus pain/pressure, cough, sore throat, chest pain, shortness of breath, abdominal pain, changes in bowel or bladder habits, etc...   I discussed the  assessment and treatment plan with the patient. The patient was provided an opportunity to ask questions and all were answered. The patient agreed with the plan and demonstrated an understanding of the instructions.   The patient was advised to call back or seek an in-person evaluation if the symptoms worsen or if the condition fails to improve as anticipated.  I provided 10 minutes of non-face-to-face  time during this encounter.  Lestine Box, PA-C  04/08/2019 5:22 PM          Lestine Box, PA-C 04/08/19 1724

## 2019-04-08 NOTE — Discharge Instructions (Signed)
Low suspicion for COVID.   Patient declines COVID testing Rest and push fluids Augmentin prescribed.  Take as directed and to completion Continue with OTC ibuprofen/tylenol as needed for pain Follow up with PCP as needed Follow up in person or go to the ED if you have any new or worsening symptoms such as fever, chills, worsening sinus pain/pressure, cough, sore throat, chest pain, shortness of breath, abdominal pain, changes in bowel or bladder habits, etc..Marland Kitchen

## 2019-04-30 ENCOUNTER — Emergency Department (INDEPENDENT_AMBULATORY_CARE_PROVIDER_SITE_OTHER)
Admission: EM | Admit: 2019-04-30 | Discharge: 2019-04-30 | Disposition: A | Discharge status: Home / Self Care | Payer: BC Managed Care – PPO | Source: Home / Self Care

## 2019-04-30 ENCOUNTER — Encounter: Payer: Self-pay | Admitting: Emergency Medicine

## 2019-04-30 ENCOUNTER — Other Ambulatory Visit: Payer: Self-pay

## 2019-04-30 DIAGNOSIS — G43009 Migraine without aura, not intractable, without status migrainosus: Secondary | ICD-10-CM | POA: Diagnosis not present

## 2019-04-30 MED ORDER — KETOROLAC TROMETHAMINE 60 MG/2ML IM SOLN
60.0000 mg | Freq: Once | INTRAMUSCULAR | Status: AC
Start: 2019-04-30 — End: 2019-04-30
  Administered 2019-04-30: 60 mg via INTRAMUSCULAR

## 2019-04-30 MED ORDER — METOCLOPRAMIDE HCL 5 MG/ML IJ SOLN: 5.0000 mg | Freq: Once | INTRAMUSCULAR | Status: DC

## 2019-04-30 MED ORDER — DEXAMETHASONE SODIUM PHOSPHATE 10 MG/ML IJ SOLN
10.0000 mg | Freq: Once | INTRAMUSCULAR | Status: AC
  Administered 2019-04-30: 12:00:00 10 mg via INTRAMUSCULAR

## 2019-04-30 MED ORDER — ONDANSETRON 4 MG PO TBDP
4.0000 mg | ORAL_TABLET | Freq: Once | ORAL | Status: AC
Start: 2019-04-30 — End: 2019-04-30
  Administered 2019-04-30: 4 mg via ORAL

## 2019-04-30 NOTE — ED Triage Notes (Signed)
Pt c/o migraine that started last night. States she has vomited from the pain. Took tylenol. Nausea and HA today.

## 2019-04-30 NOTE — ED Triage Notes (Signed)
Has appt with headache specialist in end of November. Has not eaten anything today.

## 2019-04-30 NOTE — ED Provider Notes (Signed)
[facility]    CSN: [id number] Arrival date & time: 04/30/19  1008      History   Chief Complaint Chief Complaint  Patient presents with  . Migraine    HPI Nancy Gregory is a 33 y.o. female.   HPI  Nancy Gregory is a 33 y.o. female presenting to UC with c/o gradually worsening headache that started last night c/w prior migraines.  Pain is aching and throbbing, no relief with Tylenol.  Mild nausea and photophobia. No vomiting.  No fever, chills, n/v/d. [facility] similar HA and did well with the "migraine cocktail."  She has f/u with a headache specialist at the end of November.  She notes she has had similar headaches once a month for several months now.  Pt believes they are hormonal related because they always come after her menses but her GYN directed her to a headache clinic for further evaluation. Pt is not on birth control/hormone treatment at this time.    Past Medical History:  Diagnosis Date  . Migraine     There are no active problems to display for this patient.   Past Surgical History:  Procedure Laterality Date  . TUBAL LIGATION      OB History   No obstetric history on file.      Home Medications    Prior to Admission medications   Medication Sig Start Date End Date Taking? Authorizing Provider  FLUoxetine (PROZAC) 10 MG capsule Take one cap PO daily at first sign of menses.  Continue for 7 to 10 days 03/27/19   Lattie HawBeese, Stephen A, MD  SUMAtriptan (IMITREX) 100 MG tablet 1 TAB EVERY 2 HOURS AS NEEDED FOR MIGRAINE. MAY REPEAT IN 2 HOURS IF HEADACHE PERSISTS OR RECURS. 06/03/18   [provider]    Family History History reviewed. No pertinent family history.  Social History Social History   Tobacco Use  . Smoking status: Never Smoker  . Smokeless tobacco: Never Used  Substance Use Topics  . Alcohol use: Never    Frequency: Never  . Drug use: Never     Allergies   Patient has no known  allergies.   Review of Systems Review of Systems  Constitutional: Positive for appetite change. Negative for chills and fever.  Eyes: Positive for photophobia. Negative for visual disturbance.  Cardiovascular: Negative for chest pain and palpitations.  Gastrointestinal: Positive for nausea. Negative for diarrhea and vomiting.  Musculoskeletal: Negative for neck pain and neck stiffness.  Neurological: Positive for headaches. Negative for dizziness and light-headedness.     Physical Exam Triage Vital Signs ED Triage Vitals  Enc Vitals Group     BP 04/30/19 1055 (!) 144/79     Pulse Rate 04/30/19 1055 (!) 116     Resp --      Temp 04/30/19 1055 98.3 F (36.8 C)     Temp Source 04/30/19 1055 Oral     SpO2 04/30/19 1055 98 %     Weight --      Height --      Head Circumference --      Peak Flow --      Pain Score 04/30/19 1052 8     Pain Loc --      Pain Edu? --      Excl. in [place]? --    No data found.  Updated Vital Signs BP (!) 144/79 (BP Location: Right Arm)   Pulse (!) 116  Temp 98.3 F (36.8 C) (Oral)   LMP 04/22/2019 (Approximate)   SpO2 98%   Visual Acuity Right Eye Distance:   Left Eye Distance:   Bilateral Distance:    Right Eye Near:   Left Eye Near:    Bilateral Near:     Physical Exam Vitals signs and nursing note reviewed.  Constitutional:      Appearance: Normal appearance. She is well-developed.  HENT:     Head: Normocephalic and atraumatic.     Right Ear: Tympanic membrane and ear canal normal.     Left Ear: Tympanic membrane and ear canal normal.     Nose: Nose normal.     Right Sinus: No maxillary sinus tenderness or frontal sinus tenderness.     Left Sinus: No maxillary sinus tenderness or frontal sinus tenderness.     Mouth/Throat:     Lips: Pink.     Mouth: Mucous membranes are moist.     Pharynx: Oropharynx is clear. Uvula midline.  Eyes:     Extraocular Movements: Extraocular movements intact.     Conjunctiva/sclera: Conjunctivae  normal.     Pupils: Pupils are equal, round, and reactive to light.  Neck:     Musculoskeletal: Normal range of motion.  Cardiovascular:     Rate and Rhythm: Regular rhythm. Tachycardia present.  Pulmonary:     Effort: Pulmonary effort is normal. No respiratory distress.     Breath sounds: Normal breath sounds.  Musculoskeletal: Normal range of motion.  Skin:    General: Skin is warm and dry.     Capillary Refill: Capillary refill takes less than 2 seconds.  Neurological:     General: No focal deficit present.     Mental Status: She is alert and oriented to person, place, and time.     Cranial Nerves: No cranial nerve deficit.     Sensory: No sensory deficit.     Motor: No weakness.     Coordination: Coordination normal.     Gait: Gait normal.  Psychiatric:        Mood and Affect: Mood normal.        Behavior: Behavior normal.      UC Treatments / Results  Labs (all labs ordered are listed, but only abnormal results are displayed) Labs Reviewed - No data to display  EKG   Radiology No results found.  Procedures Procedures (including critical care time)  Medications Ordered in UC Medications  dexamethasone (DECADRON) injection 10 mg (10 mg Intramuscular Given 04/30/19 1132)  ketorolac (TORADOL) injection 60 mg (60 mg Intramuscular Given 04/30/19 1133)  ondansetron (ZOFRAN-ODT) disintegrating tablet 4 mg (4 mg Oral Given 04/30/19 1133)    Initial Impression / Assessment and Plan / UC Course  I have reviewed the triage vital signs and the nursing notes.  Pertinent labs & imaging results that were available during my care of the patient were reviewed by me and considered in my medical decision making (see chart for details).     Pt is tachycardic, hx of same HA c/w prior headaches. No red flag symptoms  Will tx with toradol and decadron today Pt encouraged to f/u with specialist as previously scheduled. Discussed symptoms that warrant emergent care in the ED.    Final Clinical Impressions(s) / UC Diagnoses   Final diagnoses:  Migraine without aura and without status migrainosus, not intractable     Discharge Instructions      No Primary Care Doctor: - Call [facility] at  [phone] -  they can help you locate a primary care doctor that  accepts your insurance, provides certain services, etc. - Physician Referral [phone]     ED Prescriptions    None     PDMP not reviewed this encounter.   Noe Gens, PA-C 05/01/19 1017

## 2019-04-30 NOTE — Discharge Instructions (Signed)
°  No Primary Care Doctor: °Call [facility] at  [phone] - they can help you locate a primary care doctor that  accepts your insurance, provides certain services, etc. °Physician Referral Service- [phone] ° ° ° °

## 2019-05-23 DIAGNOSIS — G43009 Migraine without aura, not intractable, without status migrainosus: Secondary | ICD-10-CM | POA: Insufficient documentation

## 2020-03-28 ENCOUNTER — Emergency Department: Admit: 2020-03-28 | Discharge status: Absent without leave | Payer: Self-pay

## 2020-03-28 ENCOUNTER — Emergency Department (INDEPENDENT_AMBULATORY_CARE_PROVIDER_SITE_OTHER)
Admission: EM | Admit: 2020-03-28 | Discharge: 2020-03-28 | Disposition: A | Discharge status: Home / Self Care | Payer: BC Managed Care – PPO | Source: Home / Self Care

## 2020-03-28 ENCOUNTER — Other Ambulatory Visit: Payer: Self-pay

## 2020-03-28 ENCOUNTER — Emergency Department (INDEPENDENT_AMBULATORY_CARE_PROVIDER_SITE_OTHER): Payer: BC Managed Care – PPO

## 2020-03-28 DIAGNOSIS — J181 Lobar pneumonia, unspecified organism: Secondary | ICD-10-CM

## 2020-03-28 DIAGNOSIS — J189 Pneumonia, unspecified organism: Secondary | ICD-10-CM

## 2020-03-28 MED ORDER — PREDNISONE 20 MG PO TABS: 40.0000 mg | ORAL_TABLET | Freq: Every day | ORAL | 0 refills | Status: AC

## 2020-03-28 MED ORDER — ONDANSETRON HCL 4 MG/2ML IJ SOLN
4.0000 mg | Freq: Once | INTRAMUSCULAR | Status: AC
  Administered 2020-03-28: 4 mg via INTRAVENOUS

## 2020-03-28 MED ORDER — SODIUM CHLORIDE 0.9 % IV SOLN: Freq: Once | INTRAVENOUS | Status: AC

## 2020-03-28 MED ORDER — PROMETHAZINE-CODEINE 6.25-10 MG/5ML PO SYRP: 5.0000 mL | ORAL_SOLUTION | Freq: Every evening | ORAL | 0 refills | Status: DC | PRN

## 2020-03-28 MED ORDER — CEFDINIR 300 MG PO CAPS: 300.0000 mg | ORAL_CAPSULE | Freq: Two times a day (BID) | ORAL | 0 refills | Status: AC

## 2020-03-28 NOTE — ED Triage Notes (Signed)
Covid pos pt on day 10 of illness. C/o coughing and inability to keep anything down. Vomiting all night last night. Phenergan at 5am this morning. Still having fevers. Ibuprofen last night at 11pm.

## 2020-03-28 NOTE — ED Provider Notes (Signed)
[facility]    CSN: [id number] Arrival date & time: 03/28/20  1102      History   Chief Complaint Chief Complaint  Patient presents with  . Covid Positive  . Cough    HPI Nancy Gregory is a 34 y.o. female.   HPI  Patient, COVID-19 positive, x 10 days ago, present for evaluation of worsening symptoms and recent fever. She is intolerant of food and or drink. She is febrile arrival, has a persistent cough. Last vomited this morning and attempted to take phenergan which did not relieve nausea. She endorses fatigue and headache.  Past Medical History:  Diagnosis Date  . Migraine     There are no problems to display for this patient.   Past Surgical History:  Procedure Laterality Date  . TUBAL LIGATION      OB History   No obstetric history on file.      Home Medications    Prior to Admission medications   Medication Sig Start Date End Date Taking? Authorizing Provider  FLUoxetine (PROZAC) 10 MG capsule Take one cap PO daily at first sign of menses.  Continue for 7 to 10 days 03/27/19   Lattie Haw, MD  SUMAtriptan (IMITREX) 100 MG tablet 1 TAB EVERY 2 HOURS AS NEEDED FOR MIGRAINE. MAY REPEAT IN 2 HOURS IF HEADACHE PERSISTS OR RECURS. 06/03/18   [provider]    Family History History reviewed. No pertinent family history.  Social History Social History   Tobacco Use  . Smoking status: Never Smoker  . Smokeless tobacco: Never Used  Vaping Use  . Vaping Use: Never used  Substance Use Topics  . Alcohol use: Never  . Drug use: Never     Allergies   Patient has no known allergies.  Review of Systems Review of Systems Pertinent negatives listed in HPI  Physical Exam Triage Vital Signs ED Triage Vitals  Enc Vitals Group     BP 03/28/20 1110 133/82     Pulse Rate 03/28/20 1110 (!) 125     Resp 03/28/20 1110 18     Temp 03/28/20 1110 (!) 100.7 F (38.2 C)     Temp Source 03/28/20 1110 Oral     SpO2 03/28/20 1110 95 %      Weight 03/28/20 1115 199 lb (90.3 kg)     Height 03/28/20 1115 5\' 5"  (1.651 m)     Head Circumference --      Peak Flow --      Pain Score 03/28/20 1115 0     Pain Loc --      Pain Edu? --      Excl. in GC? --    No data found.  Updated Vital Signs BP 133/82 (BP Location: Left Arm)   Pulse (!) 125   Temp (!) 100.7 F (38.2 C) (Oral)   Resp 18   Ht 5\' 5"  (1.651 m)   Wt 199 lb (90.3 kg)   LMP 03/16/2020   SpO2 95%   BMI 33.12 kg/m   Visual Acuity Right Eye Distance:   Left Eye Distance:   Bilateral Distance:    Right Eye Near:   Left Eye Near:    Bilateral Near:     Physical Exam Constitutional:      General: She is not in acute distress.    Appearance: She is ill-appearing. She is not toxic-appearing.  HENT:     Nose: Nose normal.  Cardiovascular:     Rate and  Rhythm: Tachycardia present.     Pulses: Normal pulses.     Heart sounds: Normal heart sounds.  Pulmonary:     Effort: Pulmonary effort is normal.     Breath sounds: Normal breath sounds.  Abdominal:     General: Bowel sounds are normal.     Palpations: Abdomen is soft.  Skin:    General: Skin is warm.     Coloration: Skin is pale.  Neurological:     General: No focal deficit present.     Mental Status: She is alert and oriented to person, place, and time.  Psychiatric:        Mood and Affect: Mood normal.        Behavior: Behavior normal.      UC Treatments / Results  Labs (all labs ordered are listed, but only abnormal results are displayed) Labs Reviewed - No data to display  EKG   Radiology DG Chest 2 View  Result Date: 03/28/2020 CLINICAL DATA:  Cough, diagnosed with COVID-19 10 days ago EXAM: CHEST - 2 VIEW COMPARISON:  None FINDINGS: Normal heart size, mediastinal contours, and pulmonary vascularity. Patchy infiltrates in the mid/lower RIGHT lung consistent with pneumonia. Mild central peribronchial thickening. Minimal subsegmental atelectasis LEFT base. No pleural effusion or  pneumothorax. Mild dextroconvex thoracic scoliosis. IMPRESSION: Bronchitic changes with patchy infiltrates in mid/lower RIGHT lung consistent with pneumonia. Electronically Signed   By: Ulyses Southward M.D.   On: 03/28/2020 11:45    Procedures Procedures (including critical care time)  Medications Ordered in UC Medications  0.9 %  sodium chloride infusion ( Intravenous New Bag/Given 03/28/20 1149)  ondansetron (ZOFRAN) injection 4 mg (4 mg Intravenous Given 03/28/20 1149)    Initial Impression / Assessment and Plan / UC Course  I have reviewed the triage vital signs and the nursing notes.  Pertinent labs & imaging results that were available during my care of the patient were reviewed by me and considered in my medical decision making (see chart for details).      Patient with active COVID-19 presents today with persistent nausea and vomiting and febrile.   Chest x-ray confirms a localized pneumonia which is more consistent with a secondary bacterial pneumonia versus Covid pneumonia.  Patient rehydrated with a bolus IV fluids and given Tylenol for fever and Zofran IV, to relieve nausea.  Treating empirically with cefdinir 300 mg twice daily x10 days, prednisone for inflammation and to improve work of breathing, and antitussive with Phenergan with codeine.  Red flags discussed.  Patient's oxygen level stable.  Patient verbalized understanding agreement with plan.  Final Clinical Impressions(s) / UC Diagnoses   Final diagnoses:  Pneumonia of right lower lobe due to infectious organism  Pneumonia of right middle lobe due to infectious organism     Discharge Instructions     Prednisone 40 mg once daily for five days. Take with food. Start antibiotic, Omnicef  today. Hydrate well with water. Eat bland soft foods initially and as tolerated. Daytime cough suppressant you can take over the counter  Delsym.  Promethazine with codeine at bedtime.  If he develops shortness of breath, chest  pressure, fever greater than 102.5 which does not improve with ibuprofen or Tylenol recommend going immediately to the emergency department.  Complete all medications.  Symptoms will gradually improve over the course of 7 days.  Your cough may linger which is expected continue management with over-the-counter medications.     ED Prescriptions    Medication Sig Dispense Auth.  Provider   promethazine-codeine (PHENERGAN WITH CODEINE) 6.25-10 MG/5ML syrup Take 5 mLs by mouth at bedtime as needed for cough. 120 mL Bing Neighbors, FNP   predniSONE (DELTASONE) 20 MG tablet Take 2 tablets (40 mg total) by mouth daily with breakfast for 5 days. 10 tablet Bing Neighbors, FNP   cefdinir (OMNICEF) 300 MG capsule Take 1 capsule (300 mg total) by mouth 2 (two) times daily for 10 days. 20 capsule Bing Neighbors, FNP     PDMP not reviewed this encounter.   Bing Neighbors, FNP 04/02/20 [phone]

## 2020-03-28 NOTE — Discharge Instructions (Addendum)
Prednisone 40 mg once daily for five days. Take with food. Start antibiotic, Omnicef  today. Hydrate well with water. Eat bland soft foods initially and as tolerated. Daytime cough suppressant you can take over the counter  Delsym.  Promethazine with codeine at bedtime.  If he develops shortness of breath, chest pressure, fever greater than 102.5 which does not improve with ibuprofen or Tylenol recommend going immediately to the emergency department.  Complete all medications.  Symptoms will gradually improve over the course of 7 days.  Your cough may linger which is expected continue management with over-the-counter medications.

## 2020-10-16 ENCOUNTER — Encounter: Payer: Self-pay | Admitting: Emergency Medicine

## 2020-10-16 ENCOUNTER — Other Ambulatory Visit: Payer: Self-pay

## 2020-10-16 ENCOUNTER — Emergency Department (INDEPENDENT_AMBULATORY_CARE_PROVIDER_SITE_OTHER)
Admission: EM | Admit: 2020-10-16 | Discharge: 2020-10-16 | Disposition: A | Discharge status: Home / Self Care | Payer: BC Managed Care – PPO | Source: Home / Self Care | Attending: Family Medicine | Admitting: Family Medicine

## 2020-10-16 DIAGNOSIS — K529 Noninfective gastroenteritis and colitis, unspecified: Secondary | ICD-10-CM

## 2020-10-16 DIAGNOSIS — A09 Infectious gastroenteritis and colitis, unspecified: Secondary | ICD-10-CM

## 2020-10-16 DIAGNOSIS — E860 Dehydration: Secondary | ICD-10-CM

## 2020-10-16 MED ORDER — ONDANSETRON 4 MG PO TBDP: 2.0000 mg | ORAL_TABLET | Freq: Once | ORAL | Status: DC

## 2020-10-16 MED ORDER — SODIUM CHLORIDE 0.9 % IV BOLUS
1000.0000 mL | Freq: Once | INTRAVENOUS | Status: AC
  Administered 2020-10-16: 1000 mL via INTRAVENOUS

## 2020-10-16 MED ORDER — ONDANSETRON 4 MG PO TBDP
4.0000 mg | ORAL_TABLET | Freq: Once | ORAL | Status: AC
  Administered 2020-10-16: 4 mg via ORAL

## 2020-10-16 MED ORDER — ONDANSETRON HCL 8 MG PO TABS: 8.0000 mg | ORAL_TABLET | Freq: Three times a day (TID) | ORAL | 0 refills | Status: DC | PRN

## 2020-10-16 NOTE — ED Provider Notes (Signed)
[facility]    CSN: [id number] Arrival date & time: 10/16/20  0809      History   Chief Complaint Chief Complaint  Patient presents with  . Diarrhea    HPI Nancy Gregory is a 35 y.o. female.   HPI   Patient states she is usually healthy.  Occasional migraines.  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  She states that starting 2 days ago she had fever and chills, nausea vomiting diarrhea, crampy abdominal pain.  She feels weak.  She is trying to keep down water but often will throw that up.  She has had no food for 2 days.  She has been out of work, appropriately, because of diarrhea and working in a Psychologist, forensic.  No one else at home is sick.  She states that she has had COVID 6 months ago.  She states that was more respiratory illness with no nausea vomiting or diarrhea  Past Medical History:  Diagnosis Date  . Migraine     Patient Active Problem List   Diagnosis Date Noted  . Migraine without aura and without status migrainosus, not intractable 05/23/2019    Past Surgical History:  Procedure Laterality Date  . TUBAL LIGATION      OB History   No obstetric history on file.      Home Medications    Prior to Admission medications   Medication Sig Start Date End Date Taking? Authorizing Provider  ondansetron (ZOFRAN) 8 MG tablet Take 1 tablet (8 mg total) by mouth every 8 (eight) hours as needed for nausea or vomiting. 10/16/20  Yes Eustace Moore, MD  promethazine (PHENERGAN) 25 MG tablet Take 1/2-1 tablet three times daily as needed for headache or nausea 05/29/20  Yes [provider]  Ubrogepant 100 MG TABS Take by mouth. 09/06/20  Yes [provider]  FLUoxetine (PROZAC) 10 MG capsule Take one cap PO daily at first sign of menses.  Continue for 7 to 10 days 03/27/19   Lattie Haw, MD  promethazine-codeine [facility] WITH CODEINE) 6.25-10 MG/5ML syrup Take 5 mLs by mouth at bedtime as needed for  cough. Patient not taking: Reported on 10/16/2020 03/28/20   Bing Neighbors, FNP  SUMAtriptan (IMITREX) 100 MG tablet 1 TAB EVERY 2 HOURS AS NEEDED FOR MIGRAINE. MAY REPEAT IN 2 HOURS IF HEADACHE PERSISTS OR RECURS. 06/03/18   [provider]    Family History Family History  Problem Relation Age of Onset  . Healthy Mother   . Healthy Father   . Healthy Sister   . Healthy Brother   . Appendicitis Sister     Social History Social History   Tobacco Use  . Smoking status: Never Smoker  . Smokeless tobacco: Never Used  Vaping Use  . Vaping Use: Never used  Substance Use Topics  . Alcohol use: Never  . Drug use: Never     Allergies   Patient has no known allergies.   Review of Systems Review of Systems See HPI  Physical Exam Triage Vital Signs ED Triage Vitals  Enc Vitals Group     BP 10/16/20 0824 114/83     Pulse Rate 10/16/20 0824 (!) 140     Resp 10/16/20 0824 18     Temp 10/16/20 0824 (!) 102.1 F (38.9 C)     Temp Source 10/16/20 0824 Oral     SpO2 10/16/20 0824 98 %     Weight  10/16/20 0828 191 lb (86.6 kg)     Height 10/16/20 0828 5\' 5"  (1.651 m)     Head Circumference --      Peak Flow --      Pain Score 10/16/20 0827 6     Pain Loc --      Pain Edu? --      Excl. in GC? --    No data found.  Updated Vital Signs BP 102/69 (BP Location: Left Arm)   Pulse (!) 105   Temp 100 F (37.8 C) (Oral)   Resp 17   Ht 5\' 5"  (1.651 m)   Wt 86.6 kg   LMP 09/25/2020   SpO2 95%   BMI 31.78 kg/m       Physical Exam Constitutional:      General: She is not in acute distress.    Appearance: She is well-developed and normal weight. She is ill-appearing.  HENT:     Head: Normocephalic and atraumatic.     Right Ear: Tympanic membrane and ear canal normal.     Left Ear: Tympanic membrane and ear canal normal.     Nose: Nose normal. No congestion.     Mouth/Throat:     Mouth: Mucous membranes are dry.     Pharynx: No posterior oropharyngeal  erythema.  Eyes:     Conjunctiva/sclera: Conjunctivae normal.     Pupils: Pupils are equal, round, and reactive to light.  Cardiovascular:     Rate and Rhythm: Tachycardia present.     Heart sounds: Normal heart sounds.  Pulmonary:     Effort: Pulmonary effort is normal. No respiratory distress.     Breath sounds: Normal breath sounds.  Abdominal:     General: There is no distension.     Palpations: Abdomen is soft.  Musculoskeletal:        General: Normal range of motion.     Cervical back: Normal range of motion.  Lymphadenopathy:     Cervical: No cervical adenopathy.  Skin:    General: Skin is warm and dry.  Neurological:     Mental Status: She is alert.  Psychiatric:        Behavior: Behavior normal.      UC Treatments / Results  Labs (all labs ordered are listed, but only abnormal results are displayed) Labs Reviewed - No data to display  EKG   Radiology No results found.  Procedures Procedures (including critical care time)  Medications Ordered in UC Medications  ondansetron (ZOFRAN-ODT) disintegrating tablet 2 mg (2 mg Oral Not Given 10/16/20 0837)  ondansetron (ZOFRAN-ODT) disintegrating tablet 4 mg (4 mg Oral Given 10/16/20 0837)  sodium chloride 0.9 % bolus 1,000 mL (0 mLs Intravenous Stopped 10/16/20 1015)    Initial Impression / Assessment and Plan / UC Course  I have reviewed the triage vital signs and the nursing notes.  Pertinent labs & imaging results that were available during my care of the patient were reviewed by me and considered in my medical decision making (see chart for details).     Because patient has tachycardia, mildly low blood pressure, and orthostatic dizziness I believe she is dehydrated.  She states she feels weak.  We will give her some Zofran and a liter of IV fluids and reexamine.  After 1 L saline bolus patient feels improved.  She also received Zofran and nausea is improved.  We discussed rehydration.  Diet.  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  Good handwashing is recommended. Final Clinical Impressions(s) / UC Diagnoses   Final diagnoses:  Diarrhea of infectious origin  Dehydration  Gastroenteritis     Discharge Instructions     Continue to drink a lot of fluids, water, Gatorade, Pedialyte Add bland diet as soon as you are able to tolerate Take Zofran as needed for nausea and vomiting If diarrhea does not resolve on its own, may take Imodium AD, Pepto-Bismol, or Kaopectate to help Call your doctor if not improving in a couple days, if you do not have a primary care doctor we can assist you     ED Prescriptions    Medication Sig Dispense Auth. Provider   ondansetron (ZOFRAN) 8 MG tablet Take 1 tablet (8 mg total) by mouth every 8 (eight) hours as needed for nausea or vomiting. 20 tablet Eustace Moore, MD     PDMP not reviewed this encounter.   Eustace Moore, MD 10/16/20 1050

## 2020-10-16 NOTE — Discharge Instructions (Addendum)
Continue to drink a lot of fluids, water, Gatorade, Pedialyte Add bland diet as soon as you are able to tolerate Take Zofran as needed for nausea and vomiting If diarrhea does not resolve on its own, may take Imodium AD, Pepto-Bismol, or Kaopectate to help Call your doctor if not improving in a couple days, if you do not have a primary care doctor we can assist you

## 2020-10-16 NOTE — ED Triage Notes (Signed)
Pt started w/ N/V/D on Sunday evening  Tmax 101.3 at home  Diffuse abdominal pain  Phenergan last night  Ibuprofen at 0300 - threw it up  COVID 02/2020

## 2020-11-22 ENCOUNTER — Other Ambulatory Visit: Payer: Self-pay

## 2020-11-22 ENCOUNTER — Encounter: Payer: Self-pay | Admitting: Emergency Medicine

## 2020-11-22 ENCOUNTER — Emergency Department (INDEPENDENT_AMBULATORY_CARE_PROVIDER_SITE_OTHER)
Admission: EM | Admit: 2020-11-22 | Discharge: 2020-11-22 | Disposition: A | Discharge status: Home / Self Care | Payer: BC Managed Care – PPO | Source: Home / Self Care

## 2020-11-22 DIAGNOSIS — J069 Acute upper respiratory infection, unspecified: Secondary | ICD-10-CM | POA: Diagnosis not present

## 2020-11-22 MED ORDER — AMOXICILLIN-POT CLAVULANATE 875-125 MG PO TABS: 1.0000 | ORAL_TABLET | Freq: Two times a day (BID) | ORAL | 0 refills | Status: AC

## 2020-11-22 MED ORDER — MECLIZINE HCL 25 MG PO TABS
25.0000 mg | ORAL_TABLET | Freq: Three times a day (TID) | ORAL | 0 refills | Status: DC | PRN
Start: 2020-11-22 — End: 2020-11-22

## 2020-11-22 NOTE — Discharge Instructions (Addendum)
Covid is pending  

## 2020-11-22 NOTE — ED Provider Notes (Signed)
[facility]    CSN: [id number] Arrival date & time: 11/22/20  1820      History   Chief Complaint Chief Complaint  Patient presents with  . Facial Pain    HPI Nancy Gregory is a 35 y.o. female.   The history is provided by the patient. No language interpreter was used.  Cough Cough characteristics:  Non-productive Sputum characteristics:  Nondescript Severity:  Moderate Onset quality:  Gradual Timing:  Constant Progression:  Worsening Chronicity:  New Smoker: no   Worsened by:  Nothing Ineffective treatments:  None tried Associated symptoms: sinus congestion   Associated symptoms: no fever   Risk factors: no recent infection   Pt reports she thinks she has a sinus infection   Past Medical History:  Diagnosis Date  . Migraine     Patient Active Problem List   Diagnosis Date Noted  . Migraine without aura and without status migrainosus, not intractable 05/23/2019    Past Surgical History:  Procedure Laterality Date  . TUBAL LIGATION      OB History   No obstetric history on file.      Home Medications    Prior to Admission medications   Medication Sig Start Date End Date Taking? Authorizing Provider  FLUoxetine (PROZAC) 10 MG capsule Take one cap PO daily at first sign of menses.  Continue for 7 to 10 days 03/27/19   Lattie Haw, MD  ondansetron (ZOFRAN) 8 MG tablet Take 1 tablet (8 mg total) by mouth every 8 (eight) hours as needed for nausea or vomiting. Patient not taking: Reported on 11/22/2020 10/16/20   Eustace Moore, MD  promethazine (PHENERGAN) 25 MG tablet Take 1/2-1 tablet three times daily as needed for headache or nausea Patient not taking: Reported on 11/22/2020 05/29/20   [provider]  promethazine-codeine (PHENERGAN WITH CODEINE) 6.25-10 MG/5ML syrup Take 5 mLs by mouth at bedtime as needed for cough. Patient not taking: Reported on 10/16/2020 03/28/20   Bing Neighbors, FNP  SUMAtriptan (IMITREX) 100 MG  tablet 1 TAB EVERY 2 HOURS AS NEEDED FOR MIGRAINE. MAY REPEAT IN 2 HOURS IF HEADACHE PERSISTS OR RECURS. 06/03/18   [provider]  Ubrogepant 100 MG TABS Take by mouth. 09/06/20   [provider]    Family History Family History  Problem Relation Age of Onset  . Healthy Mother   . Healthy Father   . Healthy Sister   . Healthy Brother   . Appendicitis Sister     Social History Social History   Tobacco Use  . Smoking status: Never Smoker  . Smokeless tobacco: Never Used  Vaping Use  . Vaping Use: Never used  Substance Use Topics  . Alcohol use: Never  . Drug use: Never     Allergies   Patient has no known allergies.   Review of Systems Review of Systems  Constitutional: Negative for fever.  Respiratory: Positive for cough.   All other systems reviewed and are negative.    Physical Exam Triage Vital Signs ED Triage Vitals  Enc Vitals Group     BP 11/22/20 1850 114/79     Pulse Rate 11/22/20 1850 94     Resp 11/22/20 1850 16     Temp 11/22/20 1850 99.4 F (37.4 C)     Temp Source 11/22/20 1850 Oral     SpO2 11/22/20 1850 98 %     Weight --      Height --  Head Circumference --      Peak Flow --      Pain Score 11/22/20 1851 7     Pain Loc --      Pain Edu? --      Excl. in GC? --    No data found.  Updated Vital Signs BP 114/79 (BP Location: Right Arm)   Pulse 94   Temp 99.4 F (37.4 C) (Oral)   Resp 16   LMP 10/25/2020 (Exact Date)   SpO2 98%   Visual Acuity Right Eye Distance:   Left Eye Distance:   Bilateral Distance:    Right Eye Near:   Left Eye Near:    Bilateral Near:     Physical Exam Constitutional:      Appearance: She is well-developed.  HENT:     Head: Normocephalic and atraumatic.     Mouth/Throat:     Pharynx: Posterior oropharyngeal erythema present.  Eyes:     Conjunctiva/sclera: Conjunctivae normal.     Pupils: Pupils are equal, round, and reactive to light.  Cardiovascular:     Rate and  Rhythm: Normal rate.  Pulmonary:     Effort: Pulmonary effort is normal.  Abdominal:     Palpations: Abdomen is soft.  Musculoskeletal:        General: Normal range of motion.     Cervical back: Normal range of motion and neck supple.  Skin:    General: Skin is warm and dry.  Neurological:     General: No focal deficit present.     Mental Status: She is alert and oriented to person, place, and time.  Psychiatric:        Mood and Affect: Mood normal.      UC Treatments / Results  Labs (all labs ordered are listed, but only abnormal results are displayed) Labs Reviewed  COVID-19, FLU A+B NAA    EKG   Radiology No results found.  Procedures Procedures (including critical care time)  Medications Ordered in UC Medications - No data to display  Initial Impression / Assessment and Plan / UC Course  I have reviewed the triage vital signs and the nursing notes.  Pertinent labs & imaging results that were available during my care of the patient were reviewed by me and considered in my medical decision making (see chart for details).     MDM:  covid ordered  Final Clinical Impressions(s) / UC Diagnoses   Final diagnoses:  Acute upper respiratory infection   Discharge Instructions   None    ED Prescriptions    Medication Sig Dispense Auth. Provider   meclizine (ANTIVERT) 25 MG tablet  (Status: Discontinued) Take 1 tablet (25 mg total) by mouth 3 (three) times daily as needed for dizziness. 30 tablet Toa Mia K, [place]   amoxicillin-clavulanate (AUGMENTIN) 875-125 MG tablet Take 1 tablet by mouth 2 (two) times daily for 10 days. 20 tablet Elson Areas, [place]     PDMP not reviewed this encounter.  An After Visit Summary was printed and given to the patient.    Elson Areas, [place] 11/22/20 5465

## 2020-11-22 NOTE — ED Triage Notes (Signed)
Sinus pain & pressure x 10 days  Min relief w/ sudafed  OTC - tylenol q 4 hours  Pt c/o pain going into her face w/ dental pain  Hx of sinus infections 2 times a year

## 2020-11-25 LAB — COVID-19, FLU A+B NAA
Influenza A, NAA: NOT DETECTED
Influenza B, NAA: NOT DETECTED
SARS-CoV-2, NAA: DETECTED — AB

## 2020-11-25 NOTE — Progress Notes (Signed)
Spoke w/ Victorino Dike on the phone. Confirmed she was positive for COVID. Reviewed current CDC guidelines for quarantine. Okay to continue w/ antibiotics for sinus infection. No other treatment needed at this time.

## 2021-05-17 IMAGING — DX DG CHEST 2V
2 series · 2 of 2 positions shown · non-contrast
Comparison: None

CLINICAL DATA: Cough, diagnosed with QJIOS-FF 10 days ago

EXAM:
CHEST - 2 VIEW

[chest pa]
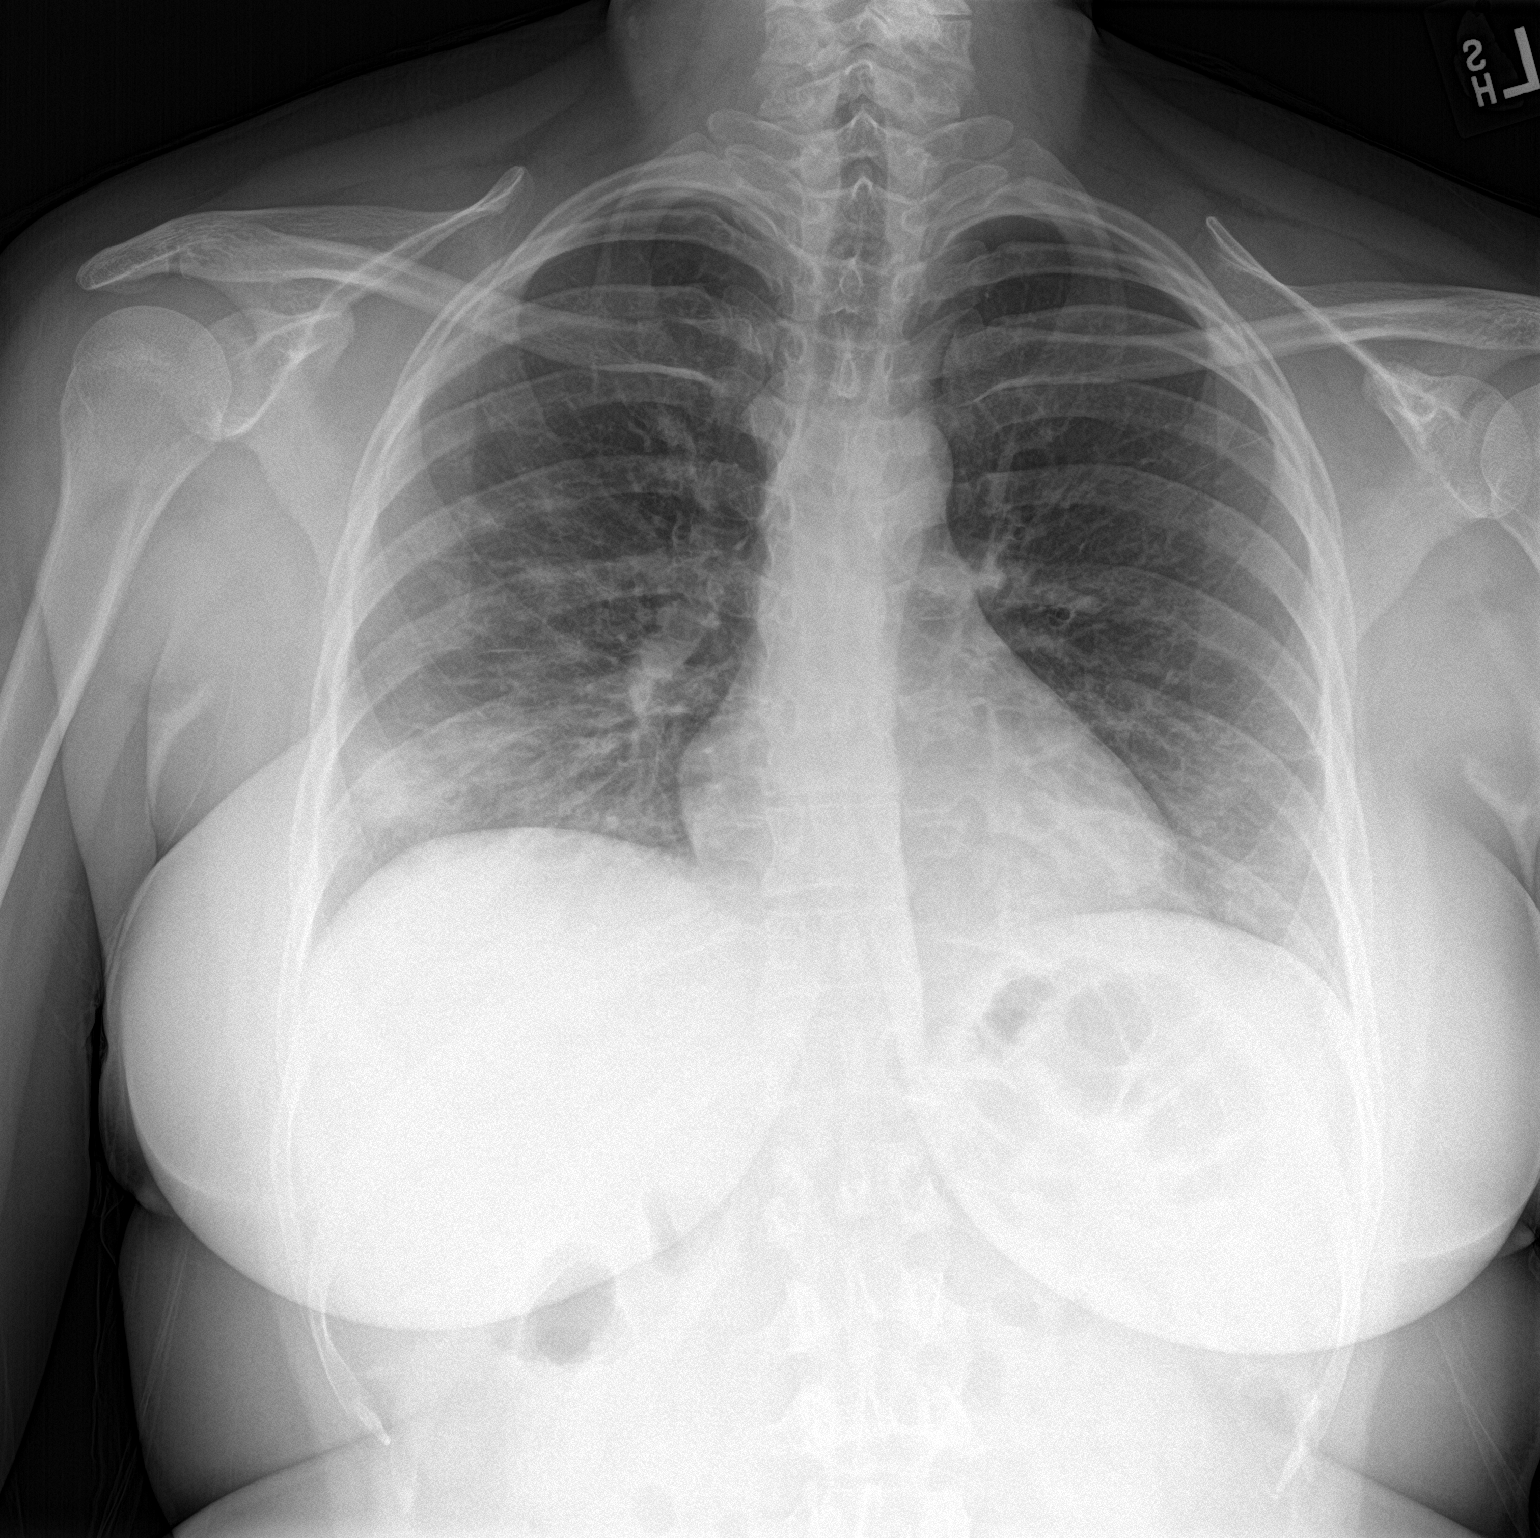

[chest lat]
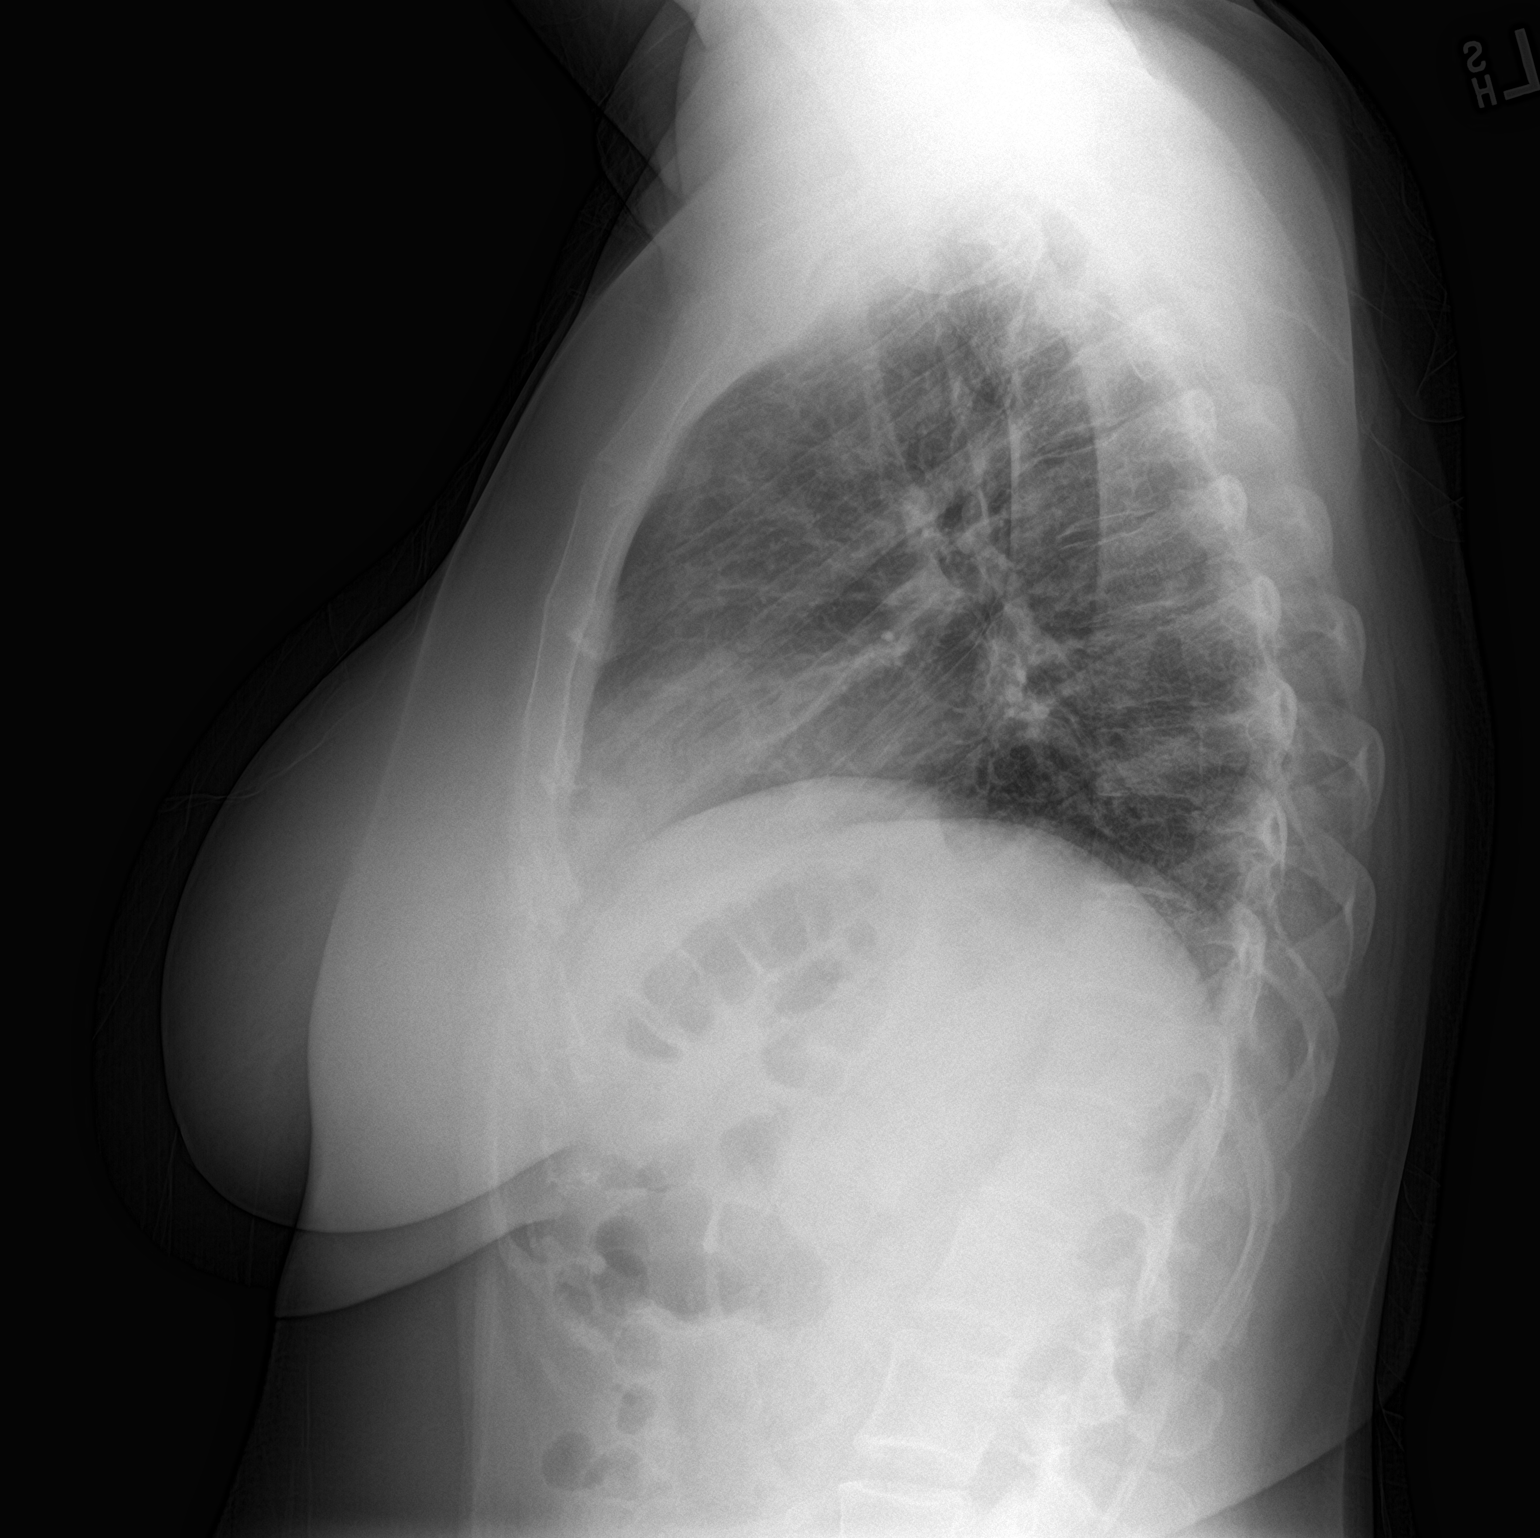

[2 of 2 positions shown; findings below may reference images not displayed]

FINDINGS: Normal heart size, mediastinal contours, and pulmonary vascularity.

Patchy infiltrates in the mid/lower RIGHT lung consistent with
pneumonia.

Mild central peribronchial thickening.

Minimal subsegmental atelectasis LEFT base.

No pleural effusion or pneumothorax.

Mild dextroconvex thoracic scoliosis.
IMPRESSION: Bronchitic changes with patchy infiltrates in mid/lower RIGHT lung
consistent with pneumonia.

## 2021-06-26 ENCOUNTER — Other Ambulatory Visit: Payer: Self-pay

## 2021-06-26 ENCOUNTER — Emergency Department (INDEPENDENT_AMBULATORY_CARE_PROVIDER_SITE_OTHER)
Admission: RE | Admit: 2021-06-26 | Discharge: 2021-06-26 | Disposition: A | Discharge status: Home / Self Care | Payer: BC Managed Care – PPO | Source: Ambulatory Visit | Attending: Emergency Medicine | Admitting: Emergency Medicine

## 2021-06-26 VITALS — BP 121/84 | HR 83 | Temp 99.4°F | Resp 20 | Ht 65.0 in | Wt 190.0 lb

## 2021-06-26 DIAGNOSIS — Z20822 Contact with and (suspected) exposure to covid-19: Secondary | ICD-10-CM

## 2021-06-26 DIAGNOSIS — G43009 Migraine without aura, not intractable, without status migrainosus: Secondary | ICD-10-CM

## 2021-06-26 DIAGNOSIS — J111 Influenza due to unidentified influenza virus with other respiratory manifestations: Secondary | ICD-10-CM | POA: Diagnosis not present

## 2021-06-26 DIAGNOSIS — Z20828 Contact with and (suspected) exposure to other viral communicable diseases: Secondary | ICD-10-CM | POA: Diagnosis not present

## 2021-06-26 LAB — POCT INFLUENZA A/B
Influenza A, POC: NEGATIVE
Influenza B, POC: NEGATIVE

## 2021-06-26 LAB — POC SARS CORONAVIRUS 2 AG -  ED: SARS Coronavirus 2 Ag: NEGATIVE

## 2021-06-26 MED ORDER — IBUPROFEN 600 MG PO TABS: 600.0000 mg | ORAL_TABLET | Freq: Four times a day (QID) | ORAL | 0 refills | Status: DC | PRN

## 2021-06-26 MED ORDER — OSELTAMIVIR PHOSPHATE 75 MG PO CAPS: 75.0000 mg | ORAL_CAPSULE | Freq: Two times a day (BID) | ORAL | 0 refills | Status: DC

## 2021-06-26 NOTE — ED Triage Notes (Signed)
Pt presents to Urgent Care with c/o fever, body aches, and bilateral ear pain since yesterday. Exposed to COVID and flu. Negative home COVID test today.

## 2021-06-26 NOTE — Discharge Instructions (Addendum)
I am starting you on Tamiflu today as you will be out of the treatment window by the time the lab comes back. discontinue it if you are flu comes back negative.  Your COVID/flu testing will be back in about 2 days I will call him Molnupiravir if your COVID is positive.  Take 600 mg of ibuprofen, 1000 mg of Tylenol 4 times a day as needed.  Rest. Flonase, Mucinex D and saline nasal irrigation with a Lloyd Huger Med rinse and distilled water as often as you want.

## 2021-06-26 NOTE — ED Provider Notes (Signed)
HPI  SUBJECTIVE:  Nancy Gregory is a 35 y.o. female who presents with headaches, body aches, fatigue, fevers T-max 100 starting last night.  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  She reports nasal congestion, sore throat, nausea.  No rhinorrhea, loss of sense of smell or taste, postnasal drip, loss of sense of smell or taste, cough, shortness of breath, vomiting, diarrhea, abdominal pain.  She did not get the COVID or flu vaccines.  She took ibuprofen within 6 hours of evaluation.  She has been alternating 600 mg of ibuprofen with 1000 mg of Tylenol every 3 hours with improvement in her symptoms.  No aggravating factors.  She has a past medical history of COVID in October 2021.  LMP: 12/2.  Denies the possibility being pregnant.  [facility]: [facility].    Past Medical History:  Diagnosis Date   Migraine     Past Surgical History:  Procedure Laterality Date   TUBAL LIGATION      Family History  Problem Relation Age of Onset   Healthy Mother    Healthy Father    Healthy Sister    Healthy Brother    Appendicitis Sister     Social History   Tobacco Use   Smoking status: Never   Smokeless tobacco: Never  Vaping Use   Vaping Use: Never used  Substance Use Topics   Alcohol use: Never   Drug use: Never    No current facility-administered medications for this encounter.  Current Outpatient Medications:    ibuprofen (ADVIL) 600 MG tablet, Take 1 tablet (600 mg total) by mouth every 6 (six) hours as needed., Disp: 30 tablet, Rfl: 0   oseltamivir (TAMIFLU) 75 MG capsule, Take 1 capsule (75 mg total) by mouth 2 (two) times daily. X 5 days, Disp: 10 capsule, Rfl: 0   rizatriptan (MAXALT) 10 MG tablet, Take 10 mg by mouth as needed for migraine. May repeat in 2 hours if needed, Disp: , Rfl:    SUMAtriptan (IMITREX) 100 MG tablet, 1 TAB EVERY 2 HOURS AS NEEDED FOR MIGRAINE. MAY REPEAT IN 2 HOURS IF HEADACHE PERSISTS OR RECURS.,  Disp: , Rfl:    Ubrogepant 100 MG TABS, Take by mouth., Disp: , Rfl:   No Known Allergies   ROS  As noted in HPI.   Physical Exam  BP 121/84 (BP Location: Right Arm)    Pulse 83    Temp 99.4 F (37.4 C) (Oral)    Resp 20    Ht 5\' 5"  (1.651 m)    Wt 86.2 kg    LMP 05/31/2021 Comment: tubal ligation   SpO2 100%    BMI 31.62 kg/m   Constitutional: Well developed, well nourished, no acute distress Eyes:  EOMI, conjunctiva normal bilaterally HENT: Normocephalic, atraumatic,mucus membranes moist.  Clear nasal congestion.  Erythematous, swollen turbinates.  Mild maxillary sinus tenderness Neck: No cervical lymphadenopathy Respiratory: Normal inspiratory effort, lungs clear bilaterally Cardiovascular: Normal rate, regular rhythm, no murmurs, rubs, gallops. GI: nondistended skin: No rash, skin intact Musculoskeletal: no deformities Neurologic: Alert & oriented x 3, no focal neuro deficits Psychiatric: Speech and behavior appropriate   ED Course   Medications - No data to display  Orders Placed This Encounter  Procedures   COVID-19, Flu A+B ([facility])    Standing Status:   Standing    Number of Occurrences:   1   POCT Influenza A/B    Standing Status:   Standing  Number of Occurrences:   1   POC SARS Coronavirus 2 Ag-ED - Nasal Swab    Standing Status:   Standing    Number of Occurrences:   1    Results for orders placed or performed during the hospital encounter of 06/26/21 (from the past 24 hour(s))  POCT Influenza A/B     Status: None   Collection Time: 06/26/21  8:47 PM  Result Value Ref Range   Influenza A, POC Negative Negative   Influenza B, POC Negative Negative  POC SARS Coronavirus 2 Ag-ED -     Status: None   Collection Time: 06/26/21  8:47 PM  Result Value Ref Range   SARS Coronavirus 2 Ag Negative Negative   No results found.  ED Clinical Impression  1. Influenza-like illness   2. Encounter for laboratory testing for COVID-19 virus   3. Exposure to  influenza   4. Exposure to COVID-19 virus      ED Assessment/Plan  Rapid COVID, flu negative.  Sending off COVID, flu PCR.  She will be a candidate for antivirals if her COVID is positive due to lack of vaccinations.  Plan to start on Molnupiravir if COVID is positive.  Will start empirically on Tamiflu today as she will be out of the treatment window by the time the lab comes back, she will discontinue it if her flu comes back negative.  Also home with ibuprofen/Tylenol.  She has Flonase, Mucinex D and saline nasal irrigation already at home.  Work note.  Follow-up with PMD as needed.  ER return precautions given.  Discussed labs, MDM, treatment plan, and plan for follow-up with patient. Discussed sn/sx that should prompt return to the ED. patient agrees with plan.   Meds ordered this encounter  Medications   oseltamivir (TAMIFLU) 75 MG capsule    Sig: Take 1 capsule (75 mg total) by mouth 2 (two) times daily. X 5 days    Dispense:  10 capsule    Refill:  0   ibuprofen (ADVIL) 600 MG tablet    Sig: Take 1 tablet (600 mg total) by mouth every 6 (six) hours as needed.    Dispense:  30 tablet    Refill:  0      *This clinic note was created using Scientist, clinical (histocompatibility and immunogenetics). Therefore, there may be occasional mistakes despite careful proofreading.  ?    Domenick Gong, MD 06/27/21 [phone]

## 2021-06-28 LAB — COVID-19, FLU A+B NAA
Influenza A, NAA: NOT DETECTED
Influenza B, NAA: NOT DETECTED
SARS-CoV-2, NAA: NOT DETECTED

## 2021-11-03 ENCOUNTER — Ambulatory Visit: Payer: Self-pay

## 2021-11-03 ENCOUNTER — Encounter: Payer: Self-pay | Admitting: Emergency Medicine

## 2021-11-03 ENCOUNTER — Emergency Department (INDEPENDENT_AMBULATORY_CARE_PROVIDER_SITE_OTHER)
Admission: EM | Admit: 2021-11-03 | Discharge: 2021-11-03 | Disposition: A | Discharge status: Home / Self Care | Payer: BC Managed Care – PPO | Source: Home / Self Care

## 2021-11-03 ENCOUNTER — Other Ambulatory Visit: Payer: Self-pay

## 2021-11-03 DIAGNOSIS — K122 Cellulitis and abscess of mouth: Secondary | ICD-10-CM

## 2021-11-03 LAB — POCT RAPID STREP A (OFFICE): Rapid Strep A Screen: NEGATIVE

## 2021-11-03 MED ORDER — PREDNISONE 20 MG PO TABS: ORAL_TABLET | ORAL | 0 refills | Status: DC

## 2021-11-03 MED ORDER — AMOXICILLIN-POT CLAVULANATE 875-125 MG PO TABS: 1.0000 | ORAL_TABLET | Freq: Two times a day (BID) | ORAL | 0 refills | Status: AC

## 2021-11-03 NOTE — ED Provider Notes (Signed)
?[facility] ? ? ? ?CSN: [id number] ?Arrival date & time: 11/03/21  7564 ? ? ?  ? ?History   ?Chief Complaint ?Chief Complaint  ?Patient presents with  ? Sore Throat  ?  Kids had strep a week ago  ? ? ?HPI ?Nancy Gregory is a 36 y.o. female.  ? ?HPI 36 year old female presents with sore throat, headache, vomiting, body aches, cough, fever (101.0) for 1 day.  Reports both of her children are being treated for strep pharyngitis. ? ?Past Medical History:  ?Diagnosis Date  ? Migraine   ? ? ?Patient Active Problem List  ? Diagnosis Date Noted  ? Migraine without aura and without status migrainosus, not intractable 05/23/2019  ? ? ?Past Surgical History:  ?Procedure Laterality Date  ? TUBAL LIGATION    ? ? ?OB History   ?No obstetric history on file. ?  ? ? ? ?Home Medications   ? ?Prior to Admission medications   ?Medication Sig Start Date End Date Taking? Authorizing Provider  ?amoxicillin-clavulanate (AUGMENTIN) 875-125 MG tablet Take 1 tablet by mouth 2 (two) times daily for 7 days. 11/03/21 11/10/21 Yes Trevor Iha, FNP  ?ibuprofen (ADVIL) 600 MG tablet Take 1 tablet (600 mg total) by mouth every 6 (six) hours as needed. 06/26/21  Yes Domenick Gong, MD  ?predniSONE (DELTASONE) 20 MG tablet Take 3 tabs PO daily x 5 days. 11/03/21  Yes Trevor Iha, FNP  ?rizatriptan (MAXALT) 10 MG tablet Take 10 mg by mouth as needed for migraine. May repeat in 2 hours if needed   Yes [provider]  ?Ubrogepant 100 MG TABS Take by mouth. 09/06/20  Yes [provider]  ?SUMAtriptan (IMITREX) 100 MG tablet 1 TAB EVERY 2 HOURS AS NEEDED FOR MIGRAINE. MAY REPEAT IN 2 HOURS IF HEADACHE PERSISTS OR RECURS. 06/03/18   [provider]  ? ? ?Family History ?Family History  ?Problem Relation Age of Onset  ? Healthy Mother   ? Healthy Father   ? Healthy Sister   ? Healthy Brother   ? Appendicitis Sister   ? ? ?Social History ?Social History  ? ?Tobacco Use  ? Smoking status: Never  ? Smokeless tobacco:  Never  ?Vaping Use  ? Vaping Use: Never used  ?Substance Use Topics  ? Alcohol use: Never  ? Drug use: Never  ? ? ? ?Allergies   ?Patient has no known allergies. ? ? ?Review of Systems ?Review of Systems  ?Constitutional:  Positive for fever.  ?HENT:  Positive for sore throat.   ?Gastrointestinal:  Positive for vomiting.  ?Musculoskeletal:  Positive for arthralgias.  ?All other systems reviewed and are negative. ? ? ?Physical Exam ?Triage Vital Signs ?ED Triage Vitals  ?Enc Vitals Group  ?   BP 11/03/21 0839 117/81  ?   Pulse Rate 11/03/21 0839 (!) 111  ?   Resp 11/03/21 0839 16  ?   Temp 11/03/21 0839 99.6 ?F (37.6 ?C)  ?   Temp Source 11/03/21 0839 Oral  ?   SpO2 11/03/21 0839 97 %  ?   Weight --   ?   Height --   ?   Head Circumference --   ?   Peak Flow --   ?   Pain Score 11/03/21 0838 3  ?   Pain Loc --   ?   Pain Edu? --   ?   Excl. in [place]? --   ? ?No data found. ? ?Updated Vital Signs ?BP 117/81 (BP Location:  Right Arm)   Pulse (!) 111   Temp 99.6 ?F (37.6 ?C) (Oral)   Resp 16   SpO2 97%  ? ?  ? ?Physical Exam ?Vitals and nursing note reviewed.  ?Constitutional:   ?   Appearance: She is well-developed. She is obese.  ?HENT:  ?   Head: Normocephalic and atraumatic.  ?   Right Ear: Tympanic membrane, ear canal and external ear normal.  ?   Left Ear: Tympanic membrane, ear canal and external ear normal.  ?   Mouth/Throat:  ?   Mouth: Mucous membranes are moist.  ?   Pharynx: Uvula midline. Pharyngeal swelling, posterior oropharyngeal erythema and uvula swelling present.  ?   Tonsils: 2+ on the right. 2+ on the left.  ?Eyes:  ?   Conjunctiva/sclera: Conjunctivae normal.  ?   Pupils: Pupils are equal, round, and reactive to light.  ?Cardiovascular:  ?   Rate and Rhythm: Normal rate and regular rhythm.  ?   Heart sounds: Normal heart sounds. No murmur heard. ?Pulmonary:  ?   Effort: Pulmonary effort is normal.  ?   Breath sounds: Normal breath sounds. No wheezing, rhonchi or rales.  ?Musculoskeletal:  ?    Cervical back: Normal range of motion and neck supple.  ?Skin: ?   General: Skin is warm and dry.  ?Neurological:  ?   General: No focal deficit present.  ?   Mental Status: She is alert and oriented to person, place, and time.  ? ? ? ?UC Treatments / Results  ?Labs ?(all labs ordered are listed, but only abnormal results are displayed) ?Labs Reviewed  ?POCT RAPID STREP A (OFFICE) - Normal  ? ? ?EKG ? ? ?Radiology ?No results found. ? ?Procedures ?Procedures (including critical care time) ? ?Medications Ordered in UC ?Medications - No data to display ? ?Initial Impression / Assessment and Plan / UC Course  ?I have reviewed the triage vital signs and the nursing notes. ? ?Pertinent labs & imaging results that were available during my care of the patient were reviewed by me and considered in my medical decision making (see chart for details). ? ?  ? ?MDM: 1.  Uvulitis-Rx'd Augmentin, Prednisone. Instructed patient to take medication as directed with food to completion.  Advised patient to take Prednisone with first dose of Augmentin for the next 5 of 7 days.  Encouraged patient to increase daily water intake while taking these medications.  Advised patient may take OTC Tylenol 1000 mg 1-3 times daily for fever.  Advised may take OTC extra strength Excedrin 1-2 tabs daily, as needed for headache.  Patient discharged home, hemodynamically stable. ?Final Clinical Impressions(s) / UC Diagnoses  ? ?Final diagnoses:  ?Uvulitis  ? ? ? ?Discharge Instructions   ? ?  ?Instructed patient to take medication as directed with food to completion.  Advised patient to take Prednisone with first dose of Augmentin for the next 5 of 7 days.  Encouraged patient to increase daily water intake while taking these medications.  Advised patient may take OTC Tylenol 1000 mg 1-3 times daily for fever.  Advised may take OTC extra strength Excedrin 1-2 tabs daily, as needed for headache. ? ? ? ? ?ED Prescriptions   ? ? Medication Sig Dispense  Auth. Provider  ? amoxicillin-clavulanate (AUGMENTIN) 875-125 MG tablet Take 1 tablet by mouth 2 (two) times daily for 7 days. 14 tablet Trevor Iha, FNP  ? predniSONE (DELTASONE) 20 MG tablet Take 3 tabs PO daily x 5  days. 15 tablet Trevor Ihaagan, Reiley Keisler, FNP  ? ?  ? ?PDMP not reviewed this encounter. ?  ?Trevor IhaRagan, Medhansh Brinkmeier, FNP ?11/03/21 0911 ? ?

## 2021-11-03 NOTE — Discharge Instructions (Addendum)
Instructed patient to take medication as directed with food to completion.  Advised patient to take Prednisone with first dose of Augmentin for the next 5 of 7 days.  Encouraged patient to increase daily water intake while taking these medications.  Advised patient may take OTC Tylenol 1000 mg 1-3 times daily for fever.  Advised may take OTC extra strength Excedrin 1-2 tabs daily, as needed for headache. ?

## 2021-11-03 NOTE — ED Triage Notes (Signed)
Patient presents to Urgent Care with complaints of sore throat, headache, vomiting, body aches, cough, fever 101 F since 1 day ago. Patient reports her children both were treated for Strep. Has tried Tylenol at 5 am and Ibuprofen  ?

## 2022-08-03 ENCOUNTER — Ambulatory Visit
Admission: RE | Admit: 2022-08-03 | Discharge: 2022-08-03 | Disposition: A | Discharge status: Home / Self Care | Payer: BC Managed Care – PPO | Source: Ambulatory Visit | Attending: Family Medicine | Admitting: Family Medicine

## 2022-08-03 VITALS — BP 119/87 | HR 80 | Temp 99.0°F | Resp 14 | Ht 65.0 in | Wt 198.0 lb

## 2022-08-03 DIAGNOSIS — N3000 Acute cystitis without hematuria: Secondary | ICD-10-CM | POA: Diagnosis not present

## 2022-08-03 LAB — POCT URINALYSIS DIP (MANUAL ENTRY)
Bilirubin, UA: NEGATIVE
Blood, UA: NEGATIVE
Glucose, UA: NEGATIVE mg/dL
Ketones, POC UA: NEGATIVE mg/dL
Leukocytes, UA: NEGATIVE
Nitrite, UA: POSITIVE — AB
Protein Ur, POC: NEGATIVE mg/dL
Spec Grav, UA: 1.02 (ref 1.010–1.025)
Urobilinogen, UA: 0.2 E.U./dL
pH, UA: 8.5 — AB (ref 5.0–8.0)

## 2022-08-03 MED ORDER — NITROFURANTOIN MONOHYD MACRO 100 MG PO CAPS: 100.0000 mg | ORAL_CAPSULE | Freq: Two times a day (BID) | ORAL | 0 refills | Status: DC

## 2022-08-03 MED ORDER — FLUCONAZOLE 150 MG PO TABS: 150.0000 mg | ORAL_TABLET | Freq: Every day | ORAL | 0 refills | Status: DC

## 2022-08-03 NOTE — ED Provider Notes (Signed)
[facility]    CSN: [id number] Arrival date & time: 08/03/22  1059      History   Chief Complaint Chief Complaint  Patient presents with   Urinary Frequency    Entered by patient    HPI Nancy Gregory is a 37 y.o. female.   HPI  Patient has had some discomfort with urination.  A sensation that she is going frequently and cannot empty her bladder.  Some suprapubic pressure and pain.  This has been going on for 2 days.  She feels like she has a bladder infection.  She did take Azo for the symptoms  Past Medical History:  Diagnosis Date   Migraine     Patient Active Problem List   Diagnosis Date Noted   Migraine without aura and without status migrainosus, not intractable 05/23/2019    Past Surgical History:  Procedure Laterality Date   TUBAL LIGATION      OB History   No obstetric history on file.      Home Medications    Prior to Admission medications   Medication Sig Start Date End Date Taking? Authorizing Provider  fluconazole (DIFLUCAN) 150 MG tablet Take 1 tablet (150 mg total) by mouth daily. Repeat in 1 week if needed 08/03/22  Yes Raylene Everts, MD  nitrofurantoin, macrocrystal-monohydrate, (MACROBID) 100 MG capsule Take 1 capsule (100 mg total) by mouth 2 (two) times daily. 08/03/22  Yes Raylene Everts, MD  rizatriptan (MAXALT) 10 MG tablet Take 10 mg by mouth as needed for migraine. May repeat in 2 hours if needed    [provider]  SUMAtriptan (IMITREX) 100 MG tablet 1 TAB EVERY 2 HOURS AS NEEDED FOR MIGRAINE. MAY REPEAT IN 2 HOURS IF HEADACHE PERSISTS OR RECURS. 06/03/18   [provider]  Ubrogepant 100 MG TABS Take by mouth. 09/06/20   [provider]    Family History Family History  Problem Relation Age of Onset   Healthy Mother    Healthy Father    Healthy Sister    Healthy Brother    Appendicitis Sister     Social History Social History   Tobacco Use   Smoking status: Never   Smokeless  tobacco: Never  Vaping Use   Vaping Use: Never used  Substance Use Topics   Alcohol use: Never   Drug use: Never     Allergies   Patient has no known allergies.   Review of Systems Review of Systems See HPI  Physical Exam Triage Vital Signs ED Triage Vitals  Enc Vitals Group     BP 08/03/22 1109 119/87     Pulse Rate 08/03/22 1109 80     Resp 08/03/22 1109 14     Temp 08/03/22 1109 99 F (37.2 C)     Temp Source 08/03/22 1109 Oral     SpO2 08/03/22 1109 100 %     Weight 08/03/22 1112 198 lb (89.8 kg)     Height 08/03/22 1112 5\' 5"  (1.651 m)     Head Circumference --      Peak Flow --      Pain Score 08/03/22 1112 2     Pain Loc --      Pain Edu? --      Excl. in [place]? --    No data found.  Updated Vital Signs BP 119/87 (BP Location: Left Arm)   Pulse 80   Temp 99 F (37.2 C) (Oral)   Resp 14  Ht 5\' 5"  (1.651 m)   Wt 89.8 kg   LMP 07/30/2022 (Exact Date)   SpO2 100%   BMI 32.95 kg/m      Physical Exam Constitutional:      General: She is not in acute distress.    Appearance: She is well-developed.  HENT:     Head: Normocephalic and atraumatic.  Eyes:     Conjunctiva/sclera: Conjunctivae normal.     Pupils: Pupils are equal, round, and reactive to light.  Cardiovascular:     Rate and Rhythm: Normal rate.  Pulmonary:     Effort: Pulmonary effort is normal. No respiratory distress.  Abdominal:     General: There is no distension.     Palpations: Abdomen is soft.     Tenderness: There is no right CVA tenderness or left CVA tenderness.  Musculoskeletal:        General: Normal range of motion.     Cervical back: Normal range of motion.  Skin:    General: Skin is warm and dry.  Neurological:     Mental Status: She is alert.      UC Treatments / Results  Labs (all labs ordered are listed, but only abnormal results are displayed) Labs Reviewed  POCT URINALYSIS DIP (MANUAL ENTRY) - Abnormal; Notable for the following components:      Result  Value   Color, UA orange (*)    pH, UA 8.5 (*)    Nitrite, UA Positive (*)    All other components within normal limits  URINE CULTURE  POCT URINALYSIS DIP (MANUAL ENTRY)    EKG   Radiology No results found.  Procedures Procedures (including critical care time)  Medications Ordered in UC Medications - No data to display  Initial Impression / Assessment and Plan / UC Course  I have reviewed the triage vital signs and the nursing notes.  Pertinent labs & imaging results that were available during my care of the patient were reviewed by me and considered in my medical decision making (see chart for details).     I explained to the patient that the Azo interferes with her ability to do testing and culture of her urine.  I will treat her with antibiotics and send the urine for culture.  She should complete 5 days of Macrobid.  See her primary care and follow-up Final Clinical Impressions(s) / UC Diagnoses   Final diagnoses:  Acute cystitis without hematuria     Discharge Instructions      Try to drink a lot of water Take the antibiotic 2 times a day for 5 days Take Diflucan if needed Your urine has been sent to the laboratory for culture.  You will be notified if a change in antibiotic is necessary Follow-up with your primary care doctor   ED Prescriptions     Medication Sig Dispense Auth. Provider   nitrofurantoin, macrocrystal-monohydrate, (MACROBID) 100 MG capsule Take 1 capsule (100 mg total) by mouth 2 (two) times daily. 10 capsule Raylene Everts, MD   fluconazole (DIFLUCAN) 150 MG tablet Take 1 tablet (150 mg total) by mouth daily. Repeat in 1 week if needed 2 tablet Raylene Everts, MD      PDMP not reviewed this encounter.   Raylene Everts, MD 08/03/22 [phone]

## 2022-08-03 NOTE — ED Triage Notes (Signed)
Urinary frequency since Friday  Last AZO dose 1500 yesterday  Pt had called PCP on Friday who instructed pt to take AZO

## 2022-08-03 NOTE — Discharge Instructions (Signed)
Try to drink a lot of water Take the antibiotic 2 times a day for 5 days Take Diflucan if needed Your urine has been sent to the laboratory for culture.  You will be notified if a change in antibiotic is necessary Follow-up with your primary care doctor

## 2022-08-04 LAB — URINE CULTURE: Culture: 20000 — AB

## 2022-08-05 ENCOUNTER — Telehealth (HOSPITAL_COMMUNITY): Payer: Self-pay | Admitting: Emergency Medicine

## 2022-08-05 MED ORDER — AMOXICILLIN 875 MG PO TABS: 875.0000 mg | ORAL_TABLET | Freq: Two times a day (BID) | ORAL | 0 refills | Status: AC

## 2023-01-10 ENCOUNTER — Ambulatory Visit
Admission: EM | Admit: 2023-01-10 | Discharge: 2023-01-10 | Disposition: A | Discharge status: Home / Self Care | Payer: BC Managed Care – PPO | Attending: Family Medicine | Admitting: Family Medicine

## 2023-01-10 ENCOUNTER — Other Ambulatory Visit: Payer: Self-pay

## 2023-01-10 DIAGNOSIS — B370 Candidal stomatitis: Secondary | ICD-10-CM

## 2023-01-10 MED ORDER — FLUCONAZOLE 200 MG PO TABS: ORAL_TABLET | ORAL | 0 refills | Status: DC

## 2023-01-10 NOTE — ED Provider Notes (Signed)
[facility]    CSN: [id number] Arrival date & time: 01/10/23  1520      History   Chief Complaint Chief Complaint  Patient presents with   oral rash    HPI Nancy Gregory is a 37 y.o. female.   HPI 37 year old female presents with white coated tongue since yesterday morning.  Reports recently on antibiotics and now has white coated tongue.  PMH significant for obesity and migraine  Past Medical History:  Diagnosis Date   Migraine     Patient Active Problem List   Diagnosis Date Noted   Migraine without aura and without status migrainosus, not intractable 05/23/2019    Past Surgical History:  Procedure Laterality Date   TUBAL LIGATION      OB History   No obstetric history on file.      Home Medications    Prior to Admission medications   Medication Sig Start Date End Date Taking? Authorizing Provider  fluconazole (DIFLUCAN) 200 MG tablet Take 1 tab p.o. now, may repeat 1 tab p.o. in 3 days if symptoms are not resolved. 01/10/23  Yes Trevor Iha, FNP  rizatriptan (MAXALT) 10 MG tablet Take 10 mg by mouth as needed for migraine. May repeat in 2 hours if needed    [provider]  SUMAtriptan (IMITREX) 100 MG tablet 1 TAB EVERY 2 HOURS AS NEEDED FOR MIGRAINE. MAY REPEAT IN 2 HOURS IF HEADACHE PERSISTS OR RECURS. 06/03/18   [provider]  Ubrogepant 100 MG TABS Take by mouth. 09/06/20   [provider]    Family History Family History  Problem Relation Age of Onset   Healthy Mother    Healthy Father    Healthy Sister    Healthy Brother    Appendicitis Sister     Social History Social History   Tobacco Use   Smoking status: Never   Smokeless tobacco: Never  Vaping Use   Vaping status: Never Used  Substance Use Topics   Alcohol use: Never   Drug use: Never     Allergies   Patient has no known allergies.   Review of Systems Review of Systems  HENT:         Concern for possible thrush  All other  systems reviewed and are negative.    Physical Exam Triage Vital Signs ED Triage Vitals  Encounter Vitals Group     BP 01/10/23 1530 118/83     Systolic BP Percentile --      Diastolic BP Percentile --      Pulse Rate 01/10/23 1530 (!) 102     Resp 01/10/23 1530 16     Temp 01/10/23 1530 98.7 F (37.1 C)     Temp Source 01/10/23 1530 Oral     SpO2 01/10/23 1530 98 %     Weight --      Height --      Head Circumference --      Peak Flow --      Pain Score 01/10/23 1529 0     Pain Loc --      Pain Education --      Exclude from Growth Chart --    No data found.  Updated Vital Signs BP 118/83   Pulse (!) 102   Temp 98.7 F (37.1 C) (Oral)   Resp 16   LMP 12/12/2022   SpO2 98%      Physical Exam Vitals and nursing note reviewed.  Constitutional:  Appearance: Normal appearance. She is obese.  HENT:     Head: Normocephalic and atraumatic.     Right Ear: Tympanic membrane, ear canal and external ear normal.     Left Ear: Tympanic membrane, ear canal and external ear normal.     Nose: Nose normal.     Mouth/Throat:     Mouth: Mucous membranes are moist.     Pharynx: Oropharynx is clear.     Comments: Thick creamy white patches noted over entire surface of tongue Eyes:     Extraocular Movements: Extraocular movements intact.     Conjunctiva/sclera: Conjunctivae normal.     Pupils: Pupils are equal, round, and reactive to light.  Cardiovascular:     Rate and Rhythm: Normal rate and regular rhythm.     Pulses: Normal pulses.     Heart sounds: Normal heart sounds.  Pulmonary:     Effort: Pulmonary effort is normal.     Breath sounds: Normal breath sounds.  Musculoskeletal:        General: Normal range of motion.     Cervical back: Normal range of motion and neck supple.  Skin:    General: Skin is warm and dry.  Neurological:     General: No focal deficit present.     Mental Status: She is alert and oriented to person, place, and time. Mental status is at  baseline.  Psychiatric:        Mood and Affect: Mood normal.        Behavior: Behavior normal.      UC Treatments / Results  Labs (all labs ordered are listed, but only abnormal results are displayed) Labs Reviewed - No data to display  EKG   Radiology No results found.  Procedures Procedures (including critical care time)  Medications Ordered in UC Medications - No data to display  Initial Impression / Assessment and Plan / UC Course  I have reviewed the triage vital signs and the nursing notes.  Pertinent labs & imaging results that were available during my care of the patient were reviewed by me and considered in my medical decision making (see chart for details).     MDM: 1. Oral candidiasis-Rx'd Diflucan 200 mg tablet take 1 tab p.o. now may repeat 1 tab p.o. in 3 days if symptoms or not completely resolved. Instructed patient to take medication as directed with food to completion.  Encouraged increase daily water intake while taking this medication.  Advised if symptoms worsen and/or unresolved please follow-up with PCP or here for further evaluation.  Discharged home, hemodynamically stable. Final Clinical Impressions(s) / UC Diagnoses   Final diagnoses:  Oral candidiasis     Discharge Instructions      Instructed patient to take medication as directed with food to completion.  Encouraged increase daily water intake while taking this medication.  Advised if symptoms worsen and/or unresolved please follow-up with PCP or here for further evaluation.     ED Prescriptions     Medication Sig Dispense Auth. Provider   fluconazole (DIFLUCAN) 200 MG tablet Take 1 tab p.o. now, may repeat 1 tab p.o. in 3 days if symptoms are not resolved. 7 tablet Trevor Iha, FNP      PDMP not reviewed this encounter.   Trevor Iha, FNP 01/10/23 [phone]

## 2023-01-10 NOTE — ED Triage Notes (Signed)
Pt presents to uc with co of white tongue since yesterday morning  Pt reports she was recently on two antibiotics and now has a white tongue

## 2023-01-10 NOTE — Discharge Instructions (Addendum)
Instructed patient to take medication as directed with food to completion.  Encouraged increase daily water intake while taking this medication.  Advised if symptoms worsen and/or unresolved please follow-up with PCP or here for further evaluation.

## 2023-04-13 ENCOUNTER — Ambulatory Visit
Admission: RE | Admit: 2023-04-13 | Discharge: 2023-04-13 | Disposition: A | Discharge status: Home / Self Care | Payer: BC Managed Care – PPO | Source: Ambulatory Visit | Attending: Family Medicine | Admitting: Family Medicine

## 2023-04-13 VITALS — BP 136/84 | HR 76 | Temp 98.4°F | Resp 17

## 2023-04-13 DIAGNOSIS — J3489 Other specified disorders of nose and nasal sinuses: Secondary | ICD-10-CM | POA: Diagnosis not present

## 2023-04-13 DIAGNOSIS — J0101 Acute recurrent maxillary sinusitis: Secondary | ICD-10-CM

## 2023-04-13 MED ORDER — PREDNISONE 20 MG PO TABS: ORAL_TABLET | ORAL | 0 refills | Status: DC

## 2023-04-13 MED ORDER — AMOXICILLIN-POT CLAVULANATE 875-125 MG PO TABS: 1.0000 | ORAL_TABLET | Freq: Two times a day (BID) | ORAL | 0 refills | Status: AC

## 2023-04-13 NOTE — ED Triage Notes (Signed)
Pt c/o nasal congestion and facial pain x 1 week. Taking mucinex, tylenol/ibuprofen and sudafed prn. Hx of sinus infections.

## 2023-04-13 NOTE — Discharge Instructions (Addendum)
Advised patient to take medications as directed with food to completion.  Advised patient to take prednisone with first dose of Augmentin for the next 5 of 10 days.  Encouraged to increase daily water intake to 64 ounces per day while taking these medications.  Advised if symptoms worsen and/or unresolved please follow-up with PCP or here for further evaluation.

## 2023-04-13 NOTE — ED Provider Notes (Signed)
[facility]    CSN: [id number] Arrival date & time: 04/13/23  1853      History   Chief Complaint Chief Complaint  Patient presents with   Nasal Congestion   Facial Pain    HPI Nancy Gregory is a 37 y.o. female.   HPI 37 year old female presents with sinus nasal congestion and facial pain for 1 week.  Reports history of migraines and sinus infections.  Past Medical History:  Diagnosis Date   Migraine     Patient Active Problem List   Diagnosis Date Noted   Migraine without aura and without status migrainosus, not intractable 05/23/2019    Past Surgical History:  Procedure Laterality Date   TUBAL LIGATION      OB History   No obstetric history on file.      Home Medications    Prior to Admission medications   Medication Sig Start Date End Date Taking? Authorizing Provider  amoxicillin-clavulanate (AUGMENTIN) 875-125 MG tablet Take 1 tablet by mouth 2 (two) times daily for 10 days. 04/13/23 04/23/23 Yes Trevor Iha, FNP  predniSONE (DELTASONE) 20 MG tablet Take 3 tabs PO daily x 5 days. 04/13/23  Yes Trevor Iha, FNP  fluconazole (DIFLUCAN) 200 MG tablet Take 1 tab p.o. now, may repeat 1 tab p.o. in 3 days if symptoms are not resolved. 01/10/23   Trevor Iha, FNP  rizatriptan (MAXALT) 10 MG tablet Take 10 mg by mouth as needed for migraine. May repeat in 2 hours if needed    [provider]  SUMAtriptan (IMITREX) 100 MG tablet 1 TAB EVERY 2 HOURS AS NEEDED FOR MIGRAINE. MAY REPEAT IN 2 HOURS IF HEADACHE PERSISTS OR RECURS. 06/03/18   [provider]  Ubrogepant 100 MG TABS Take by mouth. 09/06/20   [provider]    Family History Family History  Problem Relation Age of Onset   Healthy Mother    Healthy Father    Healthy Sister    Healthy Brother    Appendicitis Sister     Social History Social History   Tobacco Use   Smoking status: Never   Smokeless tobacco: Never  Vaping Use   Vaping status:  Never Used  Substance Use Topics   Alcohol use: Never   Drug use: Never     Allergies   Patient has no known allergies.   Review of Systems Review of Systems   Physical Exam Triage Vital Signs ED Triage Vitals [04/13/23 1901]  Encounter Vitals Group     BP 136/84     Systolic BP Percentile      Diastolic BP Percentile      Pulse Rate 76     Resp 17     Temp 98.4 F (36.9 C)     Temp Source Oral     SpO2 97 %     Weight      Height      Head Circumference      Peak Flow      Pain Score 7     Pain Loc      Pain Education      Exclude from Growth Chart    No data found.  Updated Vital Signs BP 136/84 (BP Location: Right Arm)   Pulse 76   Temp 98.4 F (36.9 C) (Oral)   Resp 17   SpO2 97%     Physical Exam Vitals and nursing note reviewed.  Constitutional:      Appearance: Normal appearance. She is  obese. She is ill-appearing.  HENT:     Head: Normocephalic and atraumatic.     Right Ear: Tympanic membrane and external ear normal.     Left Ear: Tympanic membrane and external ear normal.     Ears:     Comments: Significant eustachian tube dysfunction noted bilaterally    Nose:     Right Sinus: Maxillary sinus tenderness present.     Left Sinus: Maxillary sinus tenderness present.     Comments: Turbinates are erythematous/edematous    Mouth/Throat:     Mouth: Mucous membranes are moist.     Pharynx: Oropharynx is clear.  Eyes:     Extraocular Movements: Extraocular movements intact.     Conjunctiva/sclera: Conjunctivae normal.     Pupils: Pupils are equal, round, and reactive to light.  Cardiovascular:     Rate and Rhythm: Normal rate and regular rhythm.     Pulses: Normal pulses.     Heart sounds: Normal heart sounds.  Pulmonary:     Effort: Pulmonary effort is normal.     Breath sounds: Normal breath sounds. No wheezing, rhonchi or rales.  Musculoskeletal:        General: Normal range of motion.     Cervical back: Normal range of motion and  neck supple.  Skin:    General: Skin is warm and dry.  Neurological:     General: No focal deficit present.     Mental Status: She is alert and oriented to person, place, and time. Mental status is at baseline.  Psychiatric:        Mood and Affect: Mood normal.        Behavior: Behavior normal.      UC Treatments / Results  Labs (all labs ordered are listed, but only abnormal results are displayed) Labs Reviewed - No data to display  EKG   Radiology No results found.  Procedures Procedures (including critical care time)  Medications Ordered in UC Medications - No data to display  Initial Impression / Assessment and Plan / UC Course  I have reviewed the triage vital signs and the nursing notes.  Pertinent labs & imaging results that were available during my care of the patient were reviewed by me and considered in my medical decision making (see chart for details).     MDM: 1.  Acute recurrent maxillary sinusitis-Rx'd Augmentin 875/125 mg tablet: Take 1 tablet twice daily for the next 10 days; 2.  Sinus pressure-Rx'd prednisone 60 mg daily for the next 5 days. Advised patient to take medications as directed with food to completion.  Advised patient to take prednisone with first dose of Augmentin for the next 5 of 10 days.  Encouraged to increase daily water intake to 64 ounces per day while taking these medications.  Advised if symptoms worsen and/or unresolved please follow-up with PCP or here for further evaluation.  Work note provided to patient prior to discharge.  Patient discharged home, hemodynamically stable. Final Clinical Impressions(s) / UC Diagnoses   Final diagnoses:  Acute recurrent maxillary sinusitis  Sinus pressure     Discharge Instructions      Advised patient to take medications as directed with food to completion.  Advised patient to take prednisone with first dose of Augmentin for the next 5 of 10 days.  Encouraged to increase daily water intake  to 64 ounces per day while taking these medications.  Advised if symptoms worsen and/or unresolved please follow-up with PCP or here for further evaluation.  ED Prescriptions     Medication Sig Dispense Auth. Provider   amoxicillin-clavulanate (AUGMENTIN) 875-125 MG tablet Take 1 tablet by mouth 2 (two) times daily for 10 days. 20 tablet Trevor Iha, FNP   predniSONE (DELTASONE) 20 MG tablet Take 3 tabs PO daily x 5 days. 15 tablet Trevor Iha, FNP      PDMP not reviewed this encounter.   Trevor Iha, FNP 04/13/23 [phone]

## 2023-05-26 ENCOUNTER — Telehealth: Payer: BC Managed Care – PPO | Admitting: Nurse Practitioner

## 2023-05-26 DIAGNOSIS — J0140 Acute pansinusitis, unspecified: Secondary | ICD-10-CM | POA: Diagnosis not present

## 2023-05-26 MED ORDER — DOXYCYCLINE HYCLATE 100 MG PO TABS: 100.0000 mg | ORAL_TABLET | Freq: Two times a day (BID) | ORAL | 0 refills | Status: AC

## 2023-05-26 MED ORDER — IPRATROPIUM BROMIDE 0.03 % NA SOLN: 2.0000 | Freq: Two times a day (BID) | NASAL | 12 refills | Status: DC

## 2023-05-26 NOTE — Progress Notes (Signed)
E-Visit for Sinus Problems  We are sorry that you are not feeling well.  Here is how we plan to help!  Based on what you have shared with me it looks like you have sinusitis.  Sinusitis is inflammation and infection in the sinus cavities of the head.  Based on your presentation I believe you most likely have Acute Bacterial Sinusitis.  This is an infection caused by bacteria and is treated with antibiotics. I have prescribed Doxycycline 100mg  by mouth twice a day for 10 days.   We will also prescribe a new nasal spray for added relief   Meds ordered this encounter  Medications   doxycycline (VIBRA-TABS) 100 MG tablet    Sig: Take 1 tablet (100 mg total) by mouth 2 (two) times daily for 7 days.    Dispense:  14 tablet    Refill:  0   ipratropium (ATROVENT) 0.03 % nasal spray    Sig: Place 2 sprays into both nostrils every 12 (twelve) hours.    Dispense:  30 mL    Refill:  12   You may use an oral decongestant such as Mucinex D or if you have glaucoma or high blood pressure use plain Mucinex. Saline nasal spray help and can safely be used as often as needed for congestion.  If you develop worsening sinus pain, fever or notice severe headache and vision changes, or if symptoms are not better after completion of antibiotic, please schedule an appointment with a health care provider.    Sinus infections are not as easily transmitted as other respiratory infection, however we still recommend that you avoid close contact with loved ones, especially the very young and elderly.  Remember to wash your hands thoroughly throughout the day as this is the number one way to prevent the spread of infection!  Home Care: Only take medications as instructed by your medical team. Complete the entire course of an antibiotic. Do not take these medications with alcohol. A steam or ultrasonic humidifier can help congestion.  You can place a towel over your head and breathe in the steam from hot water coming from a  faucet. Avoid close contacts especially the very young and the elderly. Cover your mouth when you cough or sneeze. Always remember to wash your hands.  Get Help Right Away If: You develop worsening fever or sinus pain. You develop a severe head ache or visual changes. Your symptoms persist after you have completed your treatment plan.  Make sure you Understand these instructions. Will watch your condition. Will get help right away if you are not doing well or get worse.  Thank you for choosing an e-visit.  Your e-visit answers were reviewed by a board certified advanced clinical practitioner to complete your personal care plan. Depending upon the condition, your plan could have included both over the counter or prescription medications.  Please review your pharmacy choice. Make sure the pharmacy is open so you can pick up prescription now. If there is a problem, you may contact your provider through [employer] and have the prescription routed to another pharmacy.  Your safety is important to [place]. If you have drug allergies check your prescription carefully.   For the next 24 hours you can use MyChart to ask questions about today's visit, request a non-urgent call back, or ask for a work or school excuse. You will get an email in the next two days asking about your experience. I hope that your e-visit has been valuable  and will speed your recovery.   I spent approximately 5 minutes reviewing the patient's history, current symptoms and coordinating their care today.

## 2023-05-28 MED ORDER — AZITHROMYCIN 250 MG PO TABS: ORAL_TABLET | ORAL | 0 refills | Status: AC

## 2023-05-28 NOTE — Progress Notes (Signed)
Patient not tolerating doxy. Causing stomach pains and upset and vomiting. I will switch her to a zpak as of now, given she was just treated a month back with Augmentin.  Patient acknowledged agreement and understanding of the plan.

## 2023-05-28 NOTE — Addendum Note (Signed)
Addended by: Freddy Finner on: 05/28/2023 11:00 AM   Modules accepted: Orders

## 2023-06-23 ENCOUNTER — Telehealth: Payer: BC Managed Care – PPO | Admitting: Family Medicine

## 2023-06-23 DIAGNOSIS — J329 Chronic sinusitis, unspecified: Secondary | ICD-10-CM

## 2023-06-23 MED ORDER — AMOXICILLIN-POT CLAVULANATE 875-125 MG PO TABS: 1.0000 | ORAL_TABLET | Freq: Two times a day (BID) | ORAL | 0 refills | Status: DC

## 2023-06-23 MED ORDER — LEVOFLOXACIN 500 MG PO TABS: 500.0000 mg | ORAL_TABLET | Freq: Every day | ORAL | 0 refills | Status: DC

## 2023-06-23 NOTE — Progress Notes (Signed)

## 2023-06-23 NOTE — Addendum Note (Signed)
Addended by: Margaretann Loveless on: 06/23/2023 10:20 AM   Modules accepted: Orders

## 2023-06-30 ENCOUNTER — Ambulatory Visit
Admission: EM | Admit: 2023-06-30 | Discharge: 2023-06-30 | Disposition: A | Discharge status: Home / Self Care | Payer: BC Managed Care – PPO | Attending: Family Medicine | Admitting: Family Medicine

## 2023-06-30 DIAGNOSIS — U071 COVID-19: Secondary | ICD-10-CM | POA: Diagnosis not present

## 2023-06-30 LAB — POC SARS CORONAVIRUS 2 AG -  ED: SARS Coronavirus 2 Ag: POSITIVE — AB

## 2023-06-30 LAB — POCT INFLUENZA A/B
Influenza A, POC: NEGATIVE
Influenza B, POC: NEGATIVE

## 2023-06-30 MED ORDER — HYDROCOD POLI-CHLORPHE POLI ER 10-8 MG/5ML PO SUER: 5.0000 mL | Freq: Two times a day (BID) | ORAL | 0 refills | Status: DC | PRN

## 2023-06-30 MED ORDER — PAXLOVID (300/100) 20 X 150 MG & 10 X 100MG PO TBPK: ORAL_TABLET | ORAL | 0 refills | Status: DC

## 2023-06-30 NOTE — ED Triage Notes (Addendum)
 Pt presents to uc with co cough, congestion, sneezing, and ha. Pt was seen on 12/24  for symptoms and was given antibiotics for sinus pressure and pain. Pt reports she has 3 more days of antibiotics.    Pt reports cough and congestion started Sunday and was positive at home for covid.  Pt reports she did a covid test at home and it was positive on 12/ 29.   Pt needs neg test to return to work positive covid today 12/31 and flu b 12/31. Pt needs verification that she has both covid and flu.   Pt reports she has had exposures at work to both.

## 2023-06-30 NOTE — ED Provider Notes (Signed)
 [facility]    CSN: [id number] Arrival date & time: 06/30/23  1240      History   Chief Complaint Chief Complaint  Patient presents with   Covid Positive   Influenza   URI    HPI Nancy Gregory is a 37 y.o. female.   Ms. Rosello had positive flu testing and COVID testing at home.  She needs negative testing prior to be able to return to her work.  This is not in accordance to CDC guidelines.  In any event she is here requesting testing.  She has had cough and congestion and symptoms off and on for the last couple of weeks    Past Medical History:  Diagnosis Date   Migraine     Patient Active Problem List   Diagnosis Date Noted   Migraine without aura and without status migrainosus, not intractable 05/23/2019    Past Surgical History:  Procedure Laterality Date   TUBAL LIGATION      OB History   No obstetric history on file.      Home Medications    Prior to Admission medications   Medication Sig Start Date End Date Taking? Authorizing Provider  nirmatrelvir/ritonavir (PAXLOVID , 300/100,) 20 x 150 MG & 10 x 100MG  TBPK Take as directed 06/30/23  Yes Maranda Jamee Jacob, MD  chlorpheniramine-HYDROcodone (TUSSIONEX) 10-8 MG/5ML Take 5 mLs by mouth every 12 (twelve) hours as needed for cough. 06/30/23   Maranda Jamee Jacob, MD  rizatriptan (MAXALT) 10 MG tablet Take 10 mg by mouth as needed for migraine. May repeat in 2 hours if needed    [provider]    Family History Family History  Problem Relation Age of Onset   Healthy Mother    Healthy Father    Healthy Sister    Healthy Brother    Appendicitis Sister     Social History Social History   Tobacco Use   Smoking status: Never   Smokeless tobacco: Never  Vaping Use   Vaping status: Never Used  Substance Use Topics   Alcohol use: Never   Drug use: Never     Allergies   Patient has no known allergies.   Review of Systems Review of Systems  See HPI Physical  Exam Triage Vital Signs ED Triage Vitals  Encounter Vitals Group     BP 06/30/23 1326 116/85     Systolic BP Percentile --      Diastolic BP Percentile --      Pulse Rate 06/30/23 1326 89     Resp 06/30/23 1326 16     Temp 06/30/23 1326 98.8 F (37.1 C)     Temp Source 06/30/23 1326 Oral     SpO2 06/30/23 1326 98 %     Weight --      Height --      Head Circumference --      Peak Flow --      Pain Score 06/30/23 1325 0     Pain Loc --      Pain Education --      Exclude from Growth Chart --    No data found.  Updated Vital Signs BP 116/85   Pulse 89   Temp 98.8 F (37.1 C) (Oral)   Resp 16   LMP 06/29/2023   SpO2 98%       Physical Exam Constitutional:      General: She is not in acute distress.    Appearance: She is well-developed.  HENT:     Head: Normocephalic and atraumatic.  Eyes:     Conjunctiva/sclera: Conjunctivae normal.     Pupils: Pupils are equal, round, and reactive to light.  Cardiovascular:     Rate and Rhythm: Normal rate.  Pulmonary:     Effort: Pulmonary effort is normal. No respiratory distress.  Abdominal:     General: There is no distension.     Palpations: Abdomen is soft.  Musculoskeletal:        General: Normal range of motion.     Cervical back: Normal range of motion.  Skin:    General: Skin is warm and dry.  Neurological:     Mental Status: She is alert.      UC Treatments / Results  Labs (all labs ordered are listed, but only abnormal results are displayed) Labs Reviewed  POC SARS CORONAVIRUS 2 AG -  ED - Abnormal; Notable for the following components:      Result Value   SARS Coronavirus 2 Ag Positive (*)    All other components within normal limits  POCT INFLUENZA A/B - Normal    EKG   Radiology No results found.  Procedures Procedures (including critical care time)  Medications Ordered in UC Medications - No data to display  Initial Impression / Assessment and Plan / UC Course  I have reviewed the  triage vital signs and the nursing notes.  Pertinent labs & imaging results that were available during my care of the patient were reviewed by me and considered in my medical decision making (see chart for details).     Discussed return to work after COVID diagnosis.  COVID information given Final Clinical Impressions(s) / UC Diagnoses   Final diagnoses:  COVID-19     Discharge Instructions      Drink lots of fluids Take Paxlovid  2 times a day for 5 days I have prescribed a strong cough medicine to take at bedtime.  May be taken every 12 hours.  May cause drowsiness Stay out of work until symptoms have improved and no fever for 24 hours.  Wear mask for 10 days     ED Prescriptions     Medication Sig Dispense Auth. Provider   nirmatrelvir/ritonavir (PAXLOVID , 300/100,) 20 x 150 MG & 10 x 100MG  TBPK Take as directed 1 each Maranda Jamee Jacob, MD   chlorpheniramine-HYDROcodone (TUSSIONEX) 10-8 MG/5ML Take 5 mLs by mouth every 12 (twelve) hours as needed for cough. 115 mL Maranda Jamee Jacob, MD      I have reviewed the PDMP during this encounter.   Maranda Jamee Jacob, MD 06/30/23 [phone]

## 2023-06-30 NOTE — Discharge Instructions (Addendum)
 Drink lots of fluids Take Paxlovid  2 times a day for 5 days I have prescribed a strong cough medicine to take at bedtime.  May be taken every 12 hours.  May cause drowsiness Stay out of work until symptoms have improved and no fever for 24 hours.  Wear mask for 10 days

## 2023-09-06 ENCOUNTER — Telehealth: Admitting: Physician Assistant

## 2023-09-06 DIAGNOSIS — J3489 Other specified disorders of nose and nasal sinuses: Secondary | ICD-10-CM

## 2023-09-07 NOTE — Progress Notes (Signed)
 [place]

## 2023-09-18 ENCOUNTER — Telehealth: Admitting: Family Medicine

## 2023-09-18 DIAGNOSIS — J019 Acute sinusitis, unspecified: Secondary | ICD-10-CM | POA: Diagnosis not present

## 2023-09-18 MED ORDER — AMOXICILLIN-POT CLAVULANATE 875-125 MG PO TABS: 1.0000 | ORAL_TABLET | Freq: Two times a day (BID) | ORAL | 0 refills | Status: DC

## 2023-09-18 MED ORDER — AMOXICILLIN-POT CLAVULANATE 875-125 MG PO TABS: 1.0000 | ORAL_TABLET | Freq: Two times a day (BID) | ORAL | 0 refills | Status: AC

## 2023-09-18 NOTE — Progress Notes (Signed)

## 2023-11-03 ENCOUNTER — Telehealth: Payer: Self-pay | Admitting: Physician Assistant

## 2023-11-03 DIAGNOSIS — K0889 Other specified disorders of teeth and supporting structures: Secondary | ICD-10-CM

## 2023-11-03 MED ORDER — AMOXICILLIN-POT CLAVULANATE 875-125 MG PO TABS: 1.0000 | ORAL_TABLET | Freq: Two times a day (BID) | ORAL | 0 refills | Status: DC

## 2023-11-03 NOTE — Progress Notes (Signed)
 E-Visit for Dental Pain  We are sorry that you are not feeling well.  Here is how we plan to help!  We can cover you with an antibiotic in case there is a dental infection that is contributing to your pain.   Augmentin  875-125mg  twice a day for 7 days  It is imperative that you see a dentist within 10 days of this eVisit to determine the cause of the dental pain and be sure it is adequately treated  A toothache or tooth pain is caused when the nerve in the root of a tooth or surrounding a tooth is irritated. Dental (tooth) infection, decay, injury, or loss of a tooth are the most common causes of dental pain. Pain may also occur after an extraction (tooth is pulled out). Pain sometimes originates from other areas and radiates to the jaw, thus appearing to be tooth pain.Bacteria growing inside your mouth can contribute to gum disease and dental decay, both of which can cause pain. A toothache occurs from inflammation of the central portion of the tooth called pulp. The pulp contains nerve endings that are very sensitive to pain. Inflammation to the pulp or pulpitis may be caused by dental cavities, trauma, and infection.    HOME CARE:   For toothaches: Over-the-counter pain medications such as acetaminophen or ibuprofen  may be used. Take these as directed on the package while you arrange for a dental appointment. Avoid very cold or hot foods, because they may make the pain worse. You may get relief from biting on a cotton ball soaked in oil of cloves. You can get oil of cloves at most drug stores.  For jaw pain:  Aspirin may be helpful for problems in the joint of the jaw in adults. If pain happens every time you open your mouth widely, the temporomandibular joint (TMJ) may be the source of the pain. Yawning or taking a large bite of food may worsen the pain. An appointment with your doctor or dentist will help you find the cause.     GET HELP RIGHT AWAY IF:  You have a high fever or  chills If you have had a recent head or face injury and develop headache, light headedness, nausea, vomiting, or other symptoms that concern you after an injury to your face or mouth, you could have a more serious injury in addition to your dental injury. A facial rash associated with a toothache: This condition may improve with medication. Contact your doctor for them to decide what is appropriate. Any jaw pain occurring with chest pain: Although jaw pain is most commonly caused by dental disease, it is sometimes referred pain from other areas. People with heart disease, especially people who have had stents placed, people with diabetes, or those who have had heart surgery may have jaw pain as a symptom of heart attack or angina. If your jaw or tooth pain is associated with lightheadedness, sweating, or shortness of breath, you should see a doctor as soon as possible. Trouble swallowing or excessive pain or bleeding from gums: If you have a history of a weakened immune system, diabetes, or steroid use, you may be more susceptible to infections. Infections can often be more severe and extensive or caused by unusual organisms. Dental and gum infections in people with these conditions may require more aggressive treatment. An abscess may need draining or IV antibiotics, for example.  MAKE SURE YOU   Understand these instructions. Will watch your condition. Will get help right away if  you are not doing well or get worse.  Thank you for choosing an e-visit.  Your e-visit answers were reviewed by a board certified advanced clinical practitioner to complete your personal care plan. Depending upon the condition, your plan could have included both over the counter or prescription medications.  Please review your pharmacy choice. Make sure the pharmacy is open so you can pick up prescription now. If there is a problem, you may contact your provider through [employer] and have the prescription routed to  another pharmacy.  Your safety is important to us . If you have drug allergies check your prescription carefully.   For the next 24 hours you can use MyChart to ask questions about today's visit, request a non-urgent call back, or ask for a work or school excuse. You will get an email in the next two days asking about your experience. I hope that your e-visit has been valuable and will speed your recovery.   I have spent 5 minutes in review of e-visit questionnaire, review and updating patient chart, medical decision making and response to patient.   Char Common Ward, PA-C

## 2023-12-03 ENCOUNTER — Telehealth: Payer: Self-pay | Admitting: Physician Assistant

## 2023-12-03 DIAGNOSIS — H6501 Acute serous otitis media, right ear: Secondary | ICD-10-CM

## 2023-12-03 MED ORDER — SULFAMETHOXAZOLE-TRIMETHOPRIM 800-160 MG PO TABS
1.0000 | ORAL_TABLET | Freq: Two times a day (BID) | ORAL | 0 refills | Status: DC
Start: 2023-12-03 — End: 2023-12-03

## 2023-12-03 MED ORDER — AMOXICILLIN 500 MG PO CAPS: 500.0000 mg | ORAL_CAPSULE | Freq: Two times a day (BID) | ORAL | 0 refills | Status: AC

## 2023-12-03 MED ORDER — NEOMYCIN-POLYMYXIN-HC 3.5-10000-1 OT SOLN: 3.0000 [drp] | Freq: Four times a day (QID) | OTIC | 0 refills | Status: AC

## 2023-12-03 MED ORDER — CIPROFLOXACIN-DEXAMETHASONE 0.3-0.1 % OT SUSP: 4.0000 [drp] | Freq: Two times a day (BID) | OTIC | 0 refills | Status: DC

## 2023-12-03 NOTE — Progress Notes (Signed)
 E-Visit for Ear Pain - Acute Otitis Media   We are sorry that you are not feeling well. Here is how we plan to help!  Based on what you have shared with me it looks like you have Acute Otitis Media.  Acute Otitis Media is an infection of the middle or "inner" ear. This type of infection can cause redness, inflammation, and fluid buildup behind the tympanic membrane (ear drum).  The usual symptoms include: Earache/Pain Fever Upper respiratory symptoms Lack of energy/Fatigue/Malaise Slight hearing loss gradually worsening- if the inner ear fills with fluid What causes middle ear infections? Most middle ear infections occur when an infection such as a cold, leads to a build-up of mucus in the middle ear and causes the Eustachian tube (a thin tube that runs from the middle ear to the back of the nose) to become swollen or blocked.   This means mucus can't drain away properly, making it easier for an infection to spread into the middle ear.  How middle ear infections are treated: Most ear infections clear up within three to five days and don't need any specific treatment. If necessary, tylenol or ibuprofen  should be used to relieve pain and a high temperature.  If you develop a fever higher than 102, or any significantly worsening symptoms, this could indicate a more serious infection moving to the middle/inner and needs face to face evaluation in an office by a provider.   Antibiotics aren't routinely used to treat middle ear infections, although they may occasionally be prescribed if symptoms persist or are particularly severe. Given your presentation,   I have prescribed Bactrim 800/160 mg one tablet by mouth twice a day for 10 days  I have prescribed: Ciprofloxacin 0.3% and dexamethasone  0.1% otic suspension four drops in affected ears two times a day for 7 days   Your symptoms should improve over the next 3 days and should resolve in about 7 days. Be sure to complete ALL of the  prescription(s) given.  HOME CARE: Wash your hands frequently. If you are prescribed an ear drop, do not place the tip of the bottle on your ear or touch it with your fingers. You can take Acetaminophen 650 mg every 4-6 hours as needed for pain.  If pain is severe or moderate, you can apply a heating pad (set on low) or hot water bottle (wrapped in a towel) to outer ear for 20 minutes.  This will also increase drainage.  GET HELP RIGHT AWAY IF: Fever is over 102.2 degrees. You develop progressive ear pain or hearing loss. Ear symptoms persist longer than 3 days after treatment.  MAKE SURE YOU: Understand these instructions. Will watch your condition. Will get help right away if you are not doing well or get worse.  Thank you for choosing an e-visit.  Your e-visit answers were reviewed by a board certified advanced clinical practitioner to complete your personal care plan. Depending upon the condition, your plan could have included both over the counter or prescription medications.  Please review your pharmacy choice. Make sure the pharmacy is open so you can pick up the prescription now. If there is a problem, you may contact your provider through [employer] and have the prescription routed to another pharmacy.  Your safety is important to us . If you have drug allergies check your prescription carefully.   For the next 24 hours you can use MyChart to ask questions about today's visit, request a non-urgent call back, or ask for a  work or school excuse. You will get an email with a survey after your eVisit asking about your experience. We would appreciate your feedback. I hope that your e-visit has been valuable and will aid in your recovery.    I have spent 5 minutes in review of e-visit questionnaire, review and updating patient chart, medical decision making and response to patient.   Angelia Kelp, PA-C

## 2023-12-03 NOTE — Addendum Note (Signed)
 Addended by: Angelia Kelp on: 12/03/2023 04:45 PM   Modules accepted: Orders

## 2023-12-03 NOTE — Addendum Note (Signed)
 Addended by: Angelia Kelp on: 12/03/2023 04:35 PM   Modules accepted: Orders

## 2023-12-26 ENCOUNTER — Telehealth: Payer: Self-pay | Admitting: Family Medicine

## 2023-12-26 DIAGNOSIS — N760 Acute vaginitis: Secondary | ICD-10-CM

## 2023-12-26 MED ORDER — FLUCONAZOLE 150 MG PO TABS: 150.0000 mg | ORAL_TABLET | Freq: Once | ORAL | 0 refills | Status: AC

## 2023-12-26 NOTE — Progress Notes (Signed)

## 2024-01-02 ENCOUNTER — Telehealth: Payer: Self-pay | Admitting: Nurse Practitioner

## 2024-01-02 DIAGNOSIS — B9689 Other specified bacterial agents as the cause of diseases classified elsewhere: Secondary | ICD-10-CM

## 2024-01-02 DIAGNOSIS — N760 Acute vaginitis: Secondary | ICD-10-CM

## 2024-01-02 MED ORDER — METRONIDAZOLE 500 MG PO TABS: 500.0000 mg | ORAL_TABLET | Freq: Two times a day (BID) | ORAL | 0 refills | Status: AC

## 2024-01-02 NOTE — Progress Notes (Signed)
 E-Visit for Vaginal Symptoms  We are sorry that you are not feeling well. Here is how we plan to help! Based on what you shared with me it looks like you: May have a vaginosis due to bacteria  Vaginosis is an inflammation of the vagina that can result in discharge, itching and pain. The cause is usually a change in the normal balance of vaginal bacteria or an infection. Vaginosis can also result from reduced estrogen levels after menopause.  The most common causes of vaginosis are:   Bacterial vaginosis which results from an overgrowth of one on several organisms that are normally present in your vagina.   Yeast infections which are caused by a naturally occurring fungus called candida.   Vaginal atrophy (atrophic vaginosis) which results from the thinning of the vagina from reduced estrogen levels after menopause.   Trichomoniasis which is caused by a parasite and is commonly transmitted by sexual intercourse.  Factors that increase your risk of developing vaginosis include: Medications, such as antibiotics and steroids Uncontrolled diabetes Use of hygiene products such as bubble bath, vaginal spray or vaginal deodorant Douching Wearing damp or tight-fitting clothing Using an intrauterine device (IUD) for birth control Hormonal changes, such as those associated with pregnancy, birth control pills or menopause Sexual activity Having a sexually transmitted infection  Your treatment plan is Metronidazole  or Flagyl  500mg  twice a day for 7 days.  I have electronically sent this prescription into the pharmacy that you have chosen. If this is not effective you will need to see your PCP for formal testing.   Be sure to take all of the medication as directed. Stop taking any medication if you develop a rash, tongue swelling or shortness of breath. Mothers who are breast feeding should consider pumping and discarding their breast milk while on these antibiotics. However, there is no consensus  that infant exposure at these doses would be harmful.  Remember that medication creams can weaken latex condoms. SABRA   HOME CARE:  Good hygiene may prevent some types of vaginosis from recurring and may relieve some symptoms:  Avoid baths, hot tubs and whirlpool spas. Rinse soap from your outer genital area after a shower, and dry the area well to prevent irritation. Don't use scented or harsh soaps, such as those with deodorant or antibacterial action. Avoid irritants. These include scented tampons and pads. Wipe from front to back after using the toilet. Doing so avoids spreading fecal bacteria to your vagina.  Other things that may help prevent vaginosis include:  Don't douche. Your vagina doesn't require cleansing other than normal bathing. Repetitive douching disrupts the normal organisms that reside in the vagina and can actually increase your risk of vaginal infection. Douching won't clear up a vaginal infection. Use a latex condom. Both female and female latex condoms may help you avoid infections spread by sexual contact. Wear cotton underwear. Also wear pantyhose with a cotton crotch. If you feel comfortable without it, skip wearing underwear to bed. Yeast thrives in [employer] Your symptoms should improve in the next day or two.  GET HELP RIGHT AWAY IF:  You have pain in your lower abdomen ( pelvic area or over your ovaries) You develop nausea or vomiting You develop a fever Your discharge changes or worsens You have persistent pain with intercourse You develop shortness of breath, a rapid pulse, or you faint.  These symptoms could be signs of problems or infections that need to be evaluated by a medical provider now.  MAKE SURE YOU   Understand these instructions. Will watch your condition. Will get help right away if you are not doing well or get worse.  Thank you for choosing an e-visit.  Your e-visit answers were reviewed by a board certified advanced  clinical practitioner to complete your personal care plan. Depending upon the condition, your plan could have included both over the counter or prescription medications.  Please review your pharmacy choice. Make sure the pharmacy is open so you can pick up prescription now. If there is a problem, you may contact your provider through [employer] and have the prescription routed to another pharmacy.  Your safety is important to us . If you have drug allergies check your prescription carefully.   For the next 24 hours you can use MyChart to ask questions about today's visit, request a non-urgent call back, or ask for a work or school excuse. You will get an email in the next two days asking about your experience. I hope that your e-visit has been valuable and will speed your recovery.

## 2024-01-02 NOTE — Progress Notes (Signed)
 I have spent 5 minutes in review of e-visit questionnaire, review and updating patient chart, medical decision making and response to patient.   Claiborne Rigg, NP

## 2024-01-22 ENCOUNTER — Telehealth: Payer: Self-pay | Admitting: Family Medicine

## 2024-01-22 DIAGNOSIS — B370 Candidal stomatitis: Secondary | ICD-10-CM

## 2024-01-23 MED ORDER — NYSTATIN 100000 UNIT/ML MT SUSP
5.0000 mL | Freq: Four times a day (QID) | OROMUCOSAL | 0 refills | Status: DC
Start: 2024-01-23 — End: 2024-04-26

## 2024-01-23 NOTE — Progress Notes (Signed)
 E-Visit Treatment for Thrush symptoms  We are sorry that you are not feeling well. Here is how we plan to help!  Based on what you have shared with me, it appears that you have Thrush.  Nancy Gregory is a fungal (yeast) infection that can grow in your mouth, throat, and other parts of your body. With oral thrush (oral candidiasis), you may develop white, raised, cottage cheese-like lesions (spots) on your tongue and cheeks. Nancy Gregory can quickly become irritated and cause mouth pain and redness. Thrush usually develops suddenly. A common sign is the presence of creamy white, slightly raised lesions in your mouth -- usually on your tongue or inner cheeks. You may also have lesions on the roof of your mouth, gums, tonsils, or back of your throat.   Other symptoms may include: Redness and soreness inside and at the corners of your mouth Loss of sense of taste ([place]) Cottony feeling in your mouth  The lesions can hurt and may bleed a little when you scrape them or brush your teeth. In severe cases, the lesions can spread into your esophagus and cause: Pain or difficulty swallowing A feeling that food gets stuck in your throat or mid-chest area Fever, if the infection spreads beyond your esophagus  Most people have small amounts of the Candida fungus in their mouth, digestive tract and skin. When illnesses, stress or medications disturb this balance, the fungus grows out of control and causes thrush.  Medications that can make yeast flourish and cause infection include: Corticosteroids Inhalers Antibiotics Birth Control Pills  Nancy Gregory can be contagious to those at risk (like people with weakened immune systems or who take certain medications). In people with healthy immune systems, it's unusual to pass thrush through kissing or other close contact. In most cases, thrush isn't particularly contagious (meaning, it doesn't spread from person to person), but it is transmittable (meaning, you can catch it  in other ways, like through saliva when you are immunocompromised).  I have prescribed I have prescribed an antifungal - Nystatin  suspension 100,000 units/mL Swish and swallow 5mL every 6 hours for up to 10-14 days  Prevention: Practice good oral hygiene. See your dentist regularly. This is especially important if you have diabetes or wear dentures. Limit the amount of sugar and yeast-containing foods you eat. Foods such as bread, beer and wine encourage Candida growth. Avoid smoking and other tobacco use. Ask your healthcare provider about ways to help you quit smoking (We do offer a smoking cessation program through the [facility] that you can schedule on your time through MyChart). With treatment, thrush usually goes away within one to two weeks. But if your symptoms linger or get worse, please seek in-person evaluation.  Home Care: Swish with warm saltwater. Take probiotics. Eat yogurt that contains healthy bacteria.  GET HELP RIGHT AWAY IF: Ulcers that are spreading, are very large or particularly painful Ulcers last longer than one week without improving on treatment If you develop a fever, swollen glands and begin to feel unwell  MAKE SURE YOU: Understand these instructions Will watch your condition. Will get help right away if you are not doing well or get worse.  Thank you for choosing an e-visit!  Your e-visit answers were reviewed by a board certified advanced clinical practitioner to complete your personal care plan. Depending upon the condition, your plan could have included both over the counter or prescription medications.  Please review your pharmacy choice. Make sure the pharmacy is open so  you can pick up prescription now. If there is a problem, you may contact your provider through [employer] and have the prescription routed to another pharmacy. Your safety is important to us . If you have drug allergies, check your prescription  carefully.  For the next 24 hours you can use MyChart to ask questions about today's visit, request a non-urgent call back, or ask for a work or school excuse.  You will get an email in the next two days asking about your experience. I hope that your e-visit has been valuable and will speed your recovery.    have provided 5 minutes of non face to face time during this encounter for chart review and documentation.

## 2024-02-02 ENCOUNTER — Telehealth: Payer: Self-pay | Admitting: Physician Assistant

## 2024-02-02 DIAGNOSIS — R051 Acute cough: Secondary | ICD-10-CM

## 2024-02-02 MED ORDER — BENZONATATE 100 MG PO CAPS: 100.0000 mg | ORAL_CAPSULE | Freq: Three times a day (TID) | ORAL | 0 refills | Status: DC | PRN

## 2024-02-02 NOTE — Progress Notes (Signed)
 E-Visit for Cough   We are sorry that you are not feeling well.  Here is how we plan to help!  Based on your presentation I believe you most likely have A cough due to a virus.  This is called viral bronchitis and is best treated by rest, plenty of fluids and control of the cough.  You may use Ibuprofen  or Tylenol as directed to help your symptoms.     In addition you may use A non-prescription cough medication called Robitussin DAC. Take 2 teaspoons every 8 hours or Delsym: take 2 teaspoons every 12 hours., A non-prescription cough medication called Mucinex DM: take 2 tablets every 12 hours., and A prescription cough medication called Tessalon  Perles 100mg . You may take 1-2 capsules every 8 hours as needed for your cough.   From your responses in the eVisit questionnaire you describe inflammation in the upper respiratory tract which is causing a significant cough.  This is commonly called Bronchitis and has four common causes:   Allergies Viral Infections Acid Reflux Bacterial Infection Allergies, viruses and acid reflux are treated by controlling symptoms or eliminating the cause. An example might be a cough caused by taking certain blood pressure medications. You stop the cough by changing the medication. Another example might be a cough caused by acid reflux. Controlling the reflux helps control the cough.  USE OF BRONCHODILATOR (RESCUE) INHALERS: There is a risk from using your bronchodilator too frequently.  The risk is that over-reliance on a medication which only relaxes the muscles surrounding the breathing tubes can reduce the effectiveness of medications prescribed to reduce swelling and congestion of the tubes themselves.  Although you feel brief relief from the bronchodilator inhaler, your asthma may actually be worsening with the tubes becoming more swollen and filled with mucus.  This can delay other crucial treatments, such as oral steroid medications. If you need to use a  bronchodilator inhaler daily, several times per day, you should discuss this with your provider.  There are probably better treatments that could be used to keep your asthma under control.     HOME CARE Only take medications as instructed by your medical team. Complete the entire course of an antibiotic. Drink plenty of fluids and get plenty of rest. Avoid close contacts especially the very young and the elderly Cover your mouth if you cough or cough into your sleeve. Always remember to wash your hands A steam or ultrasonic humidifier can help congestion.   GET HELP RIGHT AWAY IF: You develop worsening fever. You become short of breath You cough up blood. Your symptoms persist after you have completed your treatment plan MAKE SURE YOU  Understand these instructions. Will watch your condition. Will get help right away if you are not doing well or get worse.    Thank you for choosing an e-visit.  Your e-visit answers were reviewed by a board certified advanced clinical practitioner to complete your personal care plan. Depending upon the condition, your plan could have included both over the counter or prescription medications.  Please review your pharmacy choice. Make sure the pharmacy is open so you can pick up prescription now. If there is a problem, you may contact your provider through [employer] and have the prescription routed to another pharmacy.  Your safety is important to us . If you have drug allergies check your prescription carefully.   For the next 24 hours you can use MyChart to ask questions about today's visit, request a non-urgent call back,  or ask for a work or school excuse. You will get an email in the next two days asking about your experience. I hope that your e-visit has been valuable and will speed your recovery.c  I have spent 5 minutes in review of e-visit questionnaire, review and updating patient chart, medical decision making and response to patient.    Harlene PEDLAR Ward, PA-C

## 2024-03-18 ENCOUNTER — Telehealth: Payer: Self-pay | Admitting: Family Medicine

## 2024-03-18 DIAGNOSIS — M5441 Lumbago with sciatica, right side: Secondary | ICD-10-CM

## 2024-03-18 DIAGNOSIS — M5442 Lumbago with sciatica, left side: Secondary | ICD-10-CM

## 2024-03-18 MED ORDER — BACLOFEN 10 MG PO TABS
10.0000 mg | ORAL_TABLET | Freq: Three times a day (TID) | ORAL | 0 refills | Status: DC
Start: 2024-03-18 — End: 2024-04-26

## 2024-03-18 MED ORDER — NAPROXEN 500 MG PO TABS
500.0000 mg | ORAL_TABLET | Freq: Two times a day (BID) | ORAL | 0 refills | Status: DC
Start: 2024-03-18 — End: 2024-04-26

## 2024-03-18 NOTE — Progress Notes (Signed)
 E-Visit for Back Pain   We are sorry that you are not feeling well.  Here is how we plan to help!  Based on what you have shared with me it looks like you mostly have acute back pain.  Acute back pain is defined as musculoskeletal pain that can resolve in 1-3 weeks with conservative treatment.  I have prescribed Naprosyn 500 mg take one by mouth twice a day non-steroid anti-inflammatory (NSAID) as well as Baclofen 10 mg every eight hours as needed which is a muscle relaxer  Some patients experience stomach irritation or in increased heartburn with anti-inflammatory drugs.  Please keep in mind that muscle relaxer's can cause fatigue and should not be taken while at work or driving.  Back pain is very common.  The pain often gets better over time.  The cause of back pain is usually not dangerous.  Most people can learn to manage their back pain on their own.  Home Care Stay active.  Start with short walks on flat ground if you can.  Try to walk farther each day. Do not sit, drive or stand in one place for more than 30 minutes.  Do not stay in bed. Do not avoid exercise or work.  Activity can help your back heal faster. Be careful when you bend or lift an object.  Bend at your knees, keep the object close to you, and do not twist. Sleep on a firm mattress.  Lie on your side, and bend your knees.  If you lie on your back, put a pillow under your knees. Only take medicines as told by your doctor. Put ice on the injured area. Put ice in a plastic bag Place a towel between your skin and the bag Leave the ice on for 15-20 minutes, 3-4 times a day for the first 2-3 days. 210 After that, you can switch between ice and heat packs. Ask your doctor about back exercises or massage. Avoid feeling anxious or stressed.  Find good ways to deal with stress, such as exercise.  Get Help Right Way If: Your pain does not go away with rest or medicine. Your pain does not go away in 1 week. You have new  problems. You do not feel well. The pain spreads into your legs. You cannot control when you poop (bowel movement) or pee (urinate) You feel sick to your stomach (nauseous) or throw up (vomit) You have belly (abdominal) pain. You feel like you may pass out (faint). If you develop a fever.  Make Sure you: Understand these instructions. Will watch your condition Will get help right away if you are not doing well or get worse.  Your e-visit answers were reviewed by a board certified advanced clinical practitioner to complete your personal care plan.  Depending on the condition, your plan could have included both over the counter or prescription medications.  If there is a problem please reply  once you have received a response from your provider.  Your safety is important to [place].  If you have drug allergies check your prescription carefully.    You can use MyChart to ask questions about today's visit, request a non-urgent call back, or ask for a work or school excuse for 24 hours related to this e-Visit. If it has been greater than 24 hours you will need to follow up with your provider, or enter a new e-Visit to address those concerns.  You will get an e-mail in the next two days asking about  your experience.  I hope that your e-visit has been valuable and will speed your recovery. Thank you for using e-visits.   have provided 5 minutes of non face to face time during this encounter for chart review and documentation.

## 2024-04-26 ENCOUNTER — Telehealth: Payer: Self-pay | Admitting: Physician Assistant

## 2024-04-26 DIAGNOSIS — J019 Acute sinusitis, unspecified: Secondary | ICD-10-CM

## 2024-04-26 DIAGNOSIS — B9689 Other specified bacterial agents as the cause of diseases classified elsewhere: Secondary | ICD-10-CM

## 2024-04-26 MED ORDER — AMOXICILLIN-POT CLAVULANATE 875-125 MG PO TABS: 1.0000 | ORAL_TABLET | Freq: Two times a day (BID) | ORAL | 0 refills | Status: DC

## 2024-04-26 NOTE — Progress Notes (Signed)

## 2024-05-07 ENCOUNTER — Telehealth: Payer: Self-pay | Admitting: Physician Assistant

## 2024-05-07 DIAGNOSIS — N760 Acute vaginitis: Secondary | ICD-10-CM

## 2024-05-07 MED ORDER — FLUCONAZOLE 150 MG PO TABS: 150.0000 mg | ORAL_TABLET | ORAL | 0 refills | Status: AC

## 2024-05-07 NOTE — Progress Notes (Signed)

## 2024-06-06 ENCOUNTER — Telehealth: Payer: Self-pay | Admitting: Family Medicine

## 2024-06-06 DIAGNOSIS — R3989 Other symptoms and signs involving the genitourinary system: Secondary | ICD-10-CM

## 2024-06-06 MED ORDER — CEPHALEXIN 500 MG PO CAPS: 500.0000 mg | ORAL_CAPSULE | Freq: Two times a day (BID) | ORAL | 0 refills | Status: DC

## 2024-06-06 MED ORDER — CEPHALEXIN 500 MG PO CAPS: 500.0000 mg | ORAL_CAPSULE | Freq: Two times a day (BID) | ORAL | 0 refills | Status: AC

## 2024-06-06 NOTE — Addendum Note (Signed)
 Addended by: GLADIS ELSIE BROCKS on: 06/06/2024 03:10 PM   Modules accepted: Orders

## 2024-06-06 NOTE — Progress Notes (Signed)

## 2024-07-02 ENCOUNTER — Telehealth: Payer: Self-pay | Admitting: Physician Assistant

## 2024-07-02 DIAGNOSIS — B9689 Other specified bacterial agents as the cause of diseases classified elsewhere: Secondary | ICD-10-CM

## 2024-07-02 DIAGNOSIS — R051 Acute cough: Secondary | ICD-10-CM

## 2024-07-02 DIAGNOSIS — J019 Acute sinusitis, unspecified: Secondary | ICD-10-CM

## 2024-07-02 MED ORDER — AMOXICILLIN-POT CLAVULANATE 875-125 MG PO TABS: 1.0000 | ORAL_TABLET | Freq: Two times a day (BID) | ORAL | 0 refills | Status: AC

## 2024-07-02 MED ORDER — FLUTICASONE PROPIONATE 50 MCG/ACT NA SUSP: 2.0000 | Freq: Every day | NASAL | 6 refills | Status: AC

## 2024-07-02 MED ORDER — PROMETHAZINE-DM 6.25-15 MG/5ML PO SYRP: 5.0000 mL | ORAL_SOLUTION | Freq: Three times a day (TID) | ORAL | 0 refills | Status: AC | PRN

## 2024-07-02 NOTE — Progress Notes (Signed)
"      E-Visit for Sinus Problems  We are sorry that you are not feeling well.  Here is how we plan to help!  Based on what you have shared with me it looks like you have sinusitis.  Sinusitis is inflammation and infection in the sinus cavities of the head.  Based on your presentation I believe you most likely have Acute Bacterial Sinusitis.    This is an infection caused by bacteria and is treated with antibiotics. I have prescribed Augmentin  875mg /125mg  one tablet twice daily with food, for 7 days.   You may use an oral decongestant such as Mucinex max strength, the blue and white box.   Saline nasal spray help and can safely be used as often as needed for congestion.  If you develop worsening sinus pain, fever or notice severe headache and vision changes, or if symptoms are not better after completion of antibiotic, please schedule an appointment with a health care provider.    Sinus infections are not as easily transmitted as other respiratory infection, however we still recommend that you avoid close contact with loved ones, especially the very young and elderly.  Remember to wash your hands thoroughly throughout the day as this is the number one way to prevent the spread of infection!  IF your ear pain worsens despite current treatment , please seek in person evaluation.   Home Care: Only take medications as instructed by your medical team. Complete the entire course of an antibiotic. Do not take these medications with alcohol. A steam or ultrasonic humidifier can help congestion.  You can place a towel over your head and breathe in the steam from hot water coming from a faucet. Avoid close contacts especially the very young and the elderly. Cover your mouth when you cough or sneeze. Always remember to wash your hands.  Get Help Right Away If: You develop worsening fever or sinus pain. You develop a severe head ache or visual changes. Your symptoms persist after you have completed your  treatment plan.  Make sure you Understand these instructions. Will watch your condition. Will get help right away if you are not doing well or get worse.  Your e-visit answers were reviewed by a board certified advanced clinical practitioner to complete your personal care plan.  Depending on the condition, your plan could have included both over the counter or prescription medications.  If there is a problem please reply  once you have received a response from your provider.  Your safety is important to us .  If you have drug allergies check your prescription carefully.    You can use MyChart to ask questions about todays visit, request a non-urgent call back, or ask for a work or school excuse for 24 hours related to this e-Visit. If it has been greater than 24 hours you will need to follow up with your provider, or enter a new e-Visit to address those concerns.  You will get an e-mail in the next two days asking about your experience.  I hope that your e-visit has been valuable and will speed your recovery. Thank you for using e-visits.  I have spent 5 minutes in review of e-visit questionnaire, review and updating patient chart, medical decision making and response to patient.   Leeana Creer, PA-C     "

## 2024-07-02 NOTE — Addendum Note (Signed)
 Addended by: Monque Haggar on: 07/02/2024 03:20 PM   Modules accepted: Orders

## 2024-07-20 ENCOUNTER — Telehealth: Payer: Self-pay | Admitting: Emergency Medicine

## 2024-07-20 DIAGNOSIS — J019 Acute sinusitis, unspecified: Secondary | ICD-10-CM | POA: Diagnosis not present

## 2024-07-20 DIAGNOSIS — H9201 Otalgia, right ear: Secondary | ICD-10-CM

## 2024-07-20 MED ORDER — NEOMYCIN-POLYMYXIN-HC 3.5-10000-1 OT SOLN: 3.0000 [drp] | Freq: Four times a day (QID) | OTIC | 0 refills | Status: AC

## 2024-07-20 MED ORDER — PSEUDOEPHEDRINE HCL ER 120 MG PO TB12: 120.0000 mg | ORAL_TABLET | Freq: Two times a day (BID) | ORAL | 0 refills | Status: AC

## 2024-07-20 MED ORDER — AZELASTINE-FLUTICASONE 137-50 MCG/ACT NA SUSP: 1.0000 | Freq: Two times a day (BID) | NASAL | 0 refills | Status: AC

## 2024-07-20 NOTE — Progress Notes (Signed)
 We are sorry that you are not feeling well.  Here is how we plan to help!  Based on what you have shared with me it looks like you have sinusitis.  Sinusitis is inflammation and infection in the sinus cavities of the head.  Based on your presentation I believe you most likely have Acute Viral Sinusitis.This is an infection most likely caused by a virus. There is not specific treatment for viral sinusitis other than to help you with the symptoms until the infection runs its course.  You may use an oral decongestant such as Mucinex D or if you have glaucoma or high blood pressure use plain Mucinex. Saline nasal spray help and can safely be used as often as needed for congestion, I have prescribed: Azelastine  nasal spray 2 sprays in each nostril twice a day. I've also prescribed ear drops and Sudafed for nasal congestion incase nasal spray doesn't help. Consider using humidifier for congestion.   Some authorities believe that zinc sprays or the use of Echinacea may shorten the course of your symptoms.  Sinus infections are not as easily transmitted as other respiratory infection, however we still recommend that you avoid close contact with loved ones, especially the very young and elderly.  Remember to wash your hands thoroughly throughout the day as this is the number one way to prevent the spread of infection!  Home Care: Only take medications as instructed by your medical team. Do not take these medications with alcohol. A steam or ultrasonic humidifier can help congestion.  You can place a towel over your head and breathe in the steam from hot water coming from a faucet. Avoid close contacts especially the very young and the elderly. Cover your mouth when you cough or sneeze. Always remember to wash your hands.  Get Help Right Away If: You develop worsening fever or sinus pain. You develop a severe head ache or visual changes. Your symptoms persist after you have completed your treatment  plan.  Make sure you Understand these instructions. Will watch your condition. Will get help right away if you are not doing well or get worse.  Your e-visit answers were reviewed by a board certified advanced clinical practitioner to complete your personal care plan.  Depending on the condition, your plan could have included both over the counter or prescription medications.  If there is a problem please reply  once you have received a response from your provider.  Your safety is important to us .  If you have drug allergies check your prescription carefully.    You can use MyChart to ask questions about todays visit, request a non-urgent call back, or ask for a work or school excuse for 24 hours related to this e-Visit. If it has been greater than 24 hours you will need to follow up with your provider, or enter a new e-Visit to address those concerns.  You will get an e-mail in the next two days asking about your experience.  I hope that your e-visit has been valuable and will speed your recovery. Thank you for using e-visits.  I have spent 5 minutes in review of e-visit questionnaire, review and updating patient chart, medical decision making and response to patient.   Franklyn Cafaro, PA-C
